# Patient Record
Sex: Female | Born: 1935 | Race: White | Hispanic: No | Marital: Married
Health system: Southern US, Community
[De-identification: ages and names within clinical notes are randomized; demographics above are authoritative.]

## PROBLEM LIST (undated history)

## (undated) DIAGNOSIS — G20A1 Parkinson's disease without dyskinesia, without mention of fluctuations: Secondary | ICD-10-CM

## (undated) DIAGNOSIS — M1990 Unspecified osteoarthritis, unspecified site: Secondary | ICD-10-CM

## (undated) DIAGNOSIS — G20 Parkinson's disease: Secondary | ICD-10-CM

## (undated) DIAGNOSIS — F0390 Unspecified dementia without behavioral disturbance: Secondary | ICD-10-CM

## (undated) DIAGNOSIS — C801 Malignant (primary) neoplasm, unspecified: Secondary | ICD-10-CM

## (undated) DIAGNOSIS — F32A Depression, unspecified: Secondary | ICD-10-CM

## (undated) DIAGNOSIS — E0590 Thyrotoxicosis, unspecified without thyrotoxic crisis or storm: Secondary | ICD-10-CM

## (undated) DIAGNOSIS — F329 Major depressive disorder, single episode, unspecified: Secondary | ICD-10-CM

## (undated) DIAGNOSIS — I499 Cardiac arrhythmia, unspecified: Secondary | ICD-10-CM

## (undated) DIAGNOSIS — I10 Essential (primary) hypertension: Secondary | ICD-10-CM

## (undated) HISTORY — PX: COLONOSCOPY: SHX174

## (undated) HISTORY — PX: OTHER SURGICAL HISTORY: SHX169

## (undated) HISTORY — PX: FACIAL COSMETIC SURGERY: SHX629

---

## 2018-01-07 ENCOUNTER — Other Ambulatory Visit: Payer: Self-pay | Admitting: Neurosurgery

## 2018-01-10 NOTE — Pre-Procedure Instructions (Signed)
Darlyne Stamey [facility]  01/10/2018      [facility] [place], [address] [place] [address] Phone: [phone] Fax: [phone]    Your procedure is scheduled on January 15, 2018.  Report to [facility] Admitting at 900 AM.  Call this number if you have problems the morning of surgery:  [phone]   Remember:  Do not eat or drink after midnight.    Take these medicines the morning of surgery with A SIP OF WATER  Metoprolol (Toprol XL) Amlodipine (norvasc) Methimazole (tapazole) Carbidopa-levodopa (Sinemet) Imodium-if needed Sertraline (zoloft) Tramadol (ultram)  Follow your surgeon's instructions on when to hold/resume Eliquis.  If no instructions were given call the office to determine how they would like to you take Eliquis  7 days prior to surgery STOP taking any diclofenac (voltaren) gel, Aspirin(unless otherwise instructed by your surgeon), Aleve, Naproxen, Ibuprofen, Motrin, Advil, Goody's, BC's, all herbal medications, fish oil, and all vitamins   Do not wear jewelry, make-up or nail polish.  Do not wear lotions, powders, or perfumes, or deodorant.  Do not shave 48 hours prior to surgery.  Men may shave face and neck.  Do not bring valuables to the hospital.  [facility] is not responsible for any belongings or valuables.  Contacts, dentures or bridgework may not be worn into surgery.  Leave your suitcase in the car.  After surgery it may be brought to your room.  For patients admitted to the hospital, discharge time will be determined by your treatment team.  Patients discharged the day of surgery will not be allowed to drive home.    [facility]- Preparing For Surgery  Before surgery, you can play an important role. Because skin is not sterile, your skin needs to be as free of germs as possible. You can reduce the number of germs on your skin by washing with CHG  (chlorahexidine gluconate) Soap before surgery.  CHG is an antiseptic cleaner which kills germs and bonds with the skin to continue killing germs even after washing.    Oral Hygiene is also important to reduce your risk of infection.  Remember - BRUSH YOUR TEETH THE MORNING OF SURGERY WITH YOUR REGULAR TOOTHPASTE  Please do not use if you have an allergy to CHG or antibacterial soaps. If your skin becomes reddened/irritated stop using the CHG.  Do not shave (including legs and underarms) for at least 48 hours prior to first CHG shower. It is OK to shave your face.  Please follow these instructions carefully.   1. Shower the NIGHT BEFORE SURGERY and the MORNING OF SURGERY with CHG.   2. If you chose to wash your hair, wash your hair first as usual with your normal shampoo.  3. After you shampoo, rinse your hair and body thoroughly to remove the shampoo.  4. Use CHG as you would any other liquid soap. You can apply CHG directly to the skin and wash gently with a scrungie or a clean washcloth.   5. Apply the CHG Soap to your body ONLY FROM THE NECK DOWN.  Do not use on open wounds or open sores. Avoid contact with your eyes, ears, mouth and genitals (private parts). Wash Face and genitals (private parts)  with your normal soap.  6. Wash thoroughly, paying special attention to the area where your surgery will be performed.  7. Thoroughly rinse your  body with warm water from the neck down.  8. DO NOT shower/wash with your normal soap after using and rinsing off the CHG Soap.  9. Pat yourself dry with a CLEAN TOWEL.  10. Wear CLEAN PAJAMAS to bed the night before surgery, wear comfortable clothes the morning of surgery  11. Place CLEAN SHEETS on your bed the night of your first shower and DO NOT SLEEP WITH PETS.  Day of Surgery:  Do not apply any deodorants/lotions.  Please wear clean clothes to the hospital/surgery center.   Remember to brush your teeth WITH YOUR REGULAR  TOOTHPASTE.  Please read over the following fact sheets that you were given. Pain Booklet, Coughing and Deep Breathing and Surgical Site Infection Prevention

## 2018-01-10 NOTE — Progress Notes (Addendum)
PCP: Smith Mince, MD  Cardiologist: Lamar Blinks, MD  EKG: obtained today  Stress test: requested from Dr. Lockie Pares office  ECHO: denies  Cardiac Cath: pt denies  Chest x-ray: denies  Pt on Eliquis-advised to begin holding 01/10/17

## 2018-01-13 ENCOUNTER — Encounter (HOSPITAL_COMMUNITY): Payer: Self-pay

## 2018-01-13 ENCOUNTER — Encounter (HOSPITAL_COMMUNITY)
Admission: RE | Admit: 2018-01-13 | Discharge: 2018-01-13 | Disposition: A | Discharge status: Home / Self Care | Payer: Medicare Other | Source: Ambulatory Visit | Attending: Neurosurgery | Admitting: Neurosurgery

## 2018-01-13 ENCOUNTER — Other Ambulatory Visit: Payer: Self-pay

## 2018-01-13 DIAGNOSIS — F329 Major depressive disorder, single episode, unspecified: Secondary | ICD-10-CM | POA: Diagnosis not present

## 2018-01-13 DIAGNOSIS — Z882 Allergy status to sulfonamides status: Secondary | ICD-10-CM | POA: Diagnosis not present

## 2018-01-13 DIAGNOSIS — Z79899 Other long term (current) drug therapy: Secondary | ICD-10-CM | POA: Diagnosis not present

## 2018-01-13 DIAGNOSIS — Z85828 Personal history of other malignant neoplasm of skin: Secondary | ICD-10-CM | POA: Diagnosis not present

## 2018-01-13 DIAGNOSIS — M1990 Unspecified osteoarthritis, unspecified site: Secondary | ICD-10-CM | POA: Diagnosis not present

## 2018-01-13 DIAGNOSIS — Z7901 Long term (current) use of anticoagulants: Secondary | ICD-10-CM | POA: Diagnosis not present

## 2018-01-13 DIAGNOSIS — I10 Essential (primary) hypertension: Secondary | ICD-10-CM | POA: Diagnosis not present

## 2018-01-13 DIAGNOSIS — I4891 Unspecified atrial fibrillation: Secondary | ICD-10-CM | POA: Diagnosis not present

## 2018-01-13 DIAGNOSIS — G20 Parkinson's disease: Secondary | ICD-10-CM | POA: Diagnosis not present

## 2018-01-13 DIAGNOSIS — M5116 Intervertebral disc disorders with radiculopathy, lumbar region: Secondary | ICD-10-CM | POA: Diagnosis not present

## 2018-01-13 HISTORY — DX: Major depressive disorder, single episode, unspecified: F32.9

## 2018-01-13 HISTORY — DX: Cardiac arrhythmia, unspecified: I49.9

## 2018-01-13 HISTORY — DX: Thyrotoxicosis, unspecified without thyrotoxic crisis or storm: E05.90

## 2018-01-13 HISTORY — DX: Essential (primary) hypertension: I10

## 2018-01-13 HISTORY — DX: Malignant (primary) neoplasm, unspecified: C80.1

## 2018-01-13 HISTORY — DX: Unspecified osteoarthritis, unspecified site: M19.90

## 2018-01-13 HISTORY — DX: Depression, unspecified: F32.A

## 2018-01-13 HISTORY — DX: Parkinson's disease without dyskinesia, without mention of fluctuations: G20.A1

## 2018-01-13 HISTORY — DX: Parkinson's disease: G20

## 2018-01-13 LAB — SURGICAL PCR SCREEN
MRSA, PCR: NEGATIVE
Staphylococcus aureus: NEGATIVE

## 2018-01-13 LAB — BASIC METABOLIC PANEL
ANION GAP: 10 (ref 5–15)
BUN: 17 mg/dL (ref 8–23)
CO2: 27 mmol/L (ref 22–32)
Calcium: 9.1 mg/dL (ref 8.9–10.3)
Chloride: 102 mmol/L (ref 98–111)
Creatinine, Ser: 0.81 mg/dL (ref 0.44–1.00)
GFR calc non Af Amer: >60 mL/min (ref >60)
Glucose, Bld: 115 mg/dL — ABNORMAL HIGH (ref 70–99)
POTASSIUM: 4 mmol/L (ref 3.5–5.1)
SODIUM: 139 mmol/L (ref 135–145)

## 2018-01-13 LAB — CBC
HEMATOCRIT: 43.9 % (ref 36.0–46.0)
HEMOGLOBIN: 14 g/dL (ref 12.0–15.0)
MCH: 30 pg (ref 26.0–34.0)
MCHC: 31.9 g/dL (ref 30.0–36.0)
MCV: 94.2 fL (ref 78.0–100.0)
PLATELETS: 287 10*3/uL (ref 150–400)
RBC: 4.66 MIL/uL (ref 3.87–5.11)
RDW: 12.8 % (ref 11.5–15.5)
WBC: 11.1 10*3/uL — AB (ref 4.0–10.5)

## 2018-01-13 LAB — PROTIME-INR
INR: 1.06
Prothrombin Time: 13.7 seconds (ref 11.4–15.2)

## 2018-01-13 NOTE — Progress Notes (Addendum)
Anesthesia Chart Review:  Case:  [id number] Date/Time:  01/15/18 1025   Procedure:  Microdiscectomy - L3-L4 - right (Right Back)   Anesthesia type:  General   Pre-op diagnosis:  HNP   Location:  [facility] OR ROOM 18 / [facility]   Surgeon:  Kary Kos, MD      DISCUSSION: Patient is a 84 year old female scheduled for the above procedure. 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  - Primary care visit 12/11/17 for right eye blurry vision. DDx included amaurosis fugax, acute angle glaucoma or recurrence of cataracts. She was referred urgently to ophthalmology [facility] I spoke with patient and her husband Dr. Jeri Modena who is a retired family practice physician about the outcome. Dr. Hardin Negus said that Mrs. Margerum was diagnosed with dry eyes and treated with eye drops. He said that she did not require additional testing such as carotid U/S or head CT. Patient said symptoms have resolved.   Reported being instructed to hold Eliquis starting 01/10/17 per Dr. Saintclair Halsted.   Patient up to date with endocrinology and cardiology follow-up. By Dr. Hardin Negus account, recent blurred vision felt to be related to dry eyes. Reviewed with anesthesiologist Dr. Tamela Gammon. If no acute changes then it is anticipated that he can proceed as planned. Of note, Dr. Hardin Negus said he is only planning to have Mrs. Hayse take metoprolol and amlodipine on the morning of surgery due to GI upset if other medications taken (particularly Sinemet if on an empty stomach).  Based on current OR time, patient instructed to arrive at 8:40 AM instead of 9:00 AM.  VS: BP 138/84 Comment: manually rechecked  Pulse (!) 52   Temp 36.4 C   Resp 18   Ht 5' 2.5" (1.588 m)   Wt 100 lb (45.4 kg)   SpO2 99%   BMI 18.00 kg/m   PROVIDERS: - PCP is Joneen Boers, MD [place]) -  Cardiologist is Lamar Blinks, MD (see Care Everywhere). Last visit 09/12/17 with one year follow-up recommended. - Endocrinologist is Deedra Ehrich, MD ([place]). Last office visit 10/02/17. Thyroid function tests stable on low dose methimazole. Had dominant right mid-inferior pole hot nodule, hyperactive on 2009 scan, but stable by serial ultrasounds. Also left inferior pole cold nodule with benign FNA '11. One year follow-up recommended.  - Neurologist is Kyra Searles, MD with [facility] (although I am not currently able to link her [place] records)   LABS: Labs reviewed: Acceptable for surgery. Normal TSH 0.716 on 04/01/17.  (all labs ordered are listed, but only abnormal results are displayed)  Labs Reviewed  BASIC METABOLIC PANEL - Abnormal; Notable for the following components:      Result Value   Glucose, Bld 115 (*)    All other components within normal limits  CBC - Abnormal; Notable for the following components:   WBC 11.1 (*)    All other components within normal limits  SURGICAL PCR SCREEN  PROTIME-INR    EKG: EKG 01/13/18: SB at 54 bpm, minimal voltage criteria for LVH, may be normal variant. Non-specific ST/T wave abnormality. (Currently, no comparison EKG tracing. By [place], 09/12/17 EKG showed SR, non-specific T wave abnormality.UPDATE: tracing received and placed on patient's chart.)  CV: 01/2015 stress echo report requested, but according to 03/15/15 cardiology note Zettie Pho, [place]; [facility]), "She had a recent stress echo  which looked okay. She has a history of normal LV function."   Update 01/14/18 3:43 PM:  01/25/15 Stress echo report received from [place] (under the name Durward Mallard Cassity Stamey): Summary:  This was a normal stress echocardiogram.  No inducible ischemia.  There is normal regional wall motion and ejection fraction at rest with hyperdynamic response in all LV segments and  appropriate augmentation of overall ejection fraction after stress. Test terminated secondary to achieving diagnostic endpoint with 73% Max Peak HR achieved.  The left ventricle is normal in size.  Proximal septal thickening is noted.  The left ventricular ejection fraction is normal (60-65%).  Left ventricular wall motion is normal.  The aortic valve is trileaflet.  Grade 1 mild diastolic dysfunction; abnormal relaxation pattern.  Right ventricular systolic pressure is normal.  No inducible ischemia.  Post exercise ECG demonstrates no new ST segment depression.   Past Medical History:  Diagnosis Date  . Arthritis   . 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 Depression    on zoloft  . Dysrhythmia    Atrial fibrilliation  . Hypertension   . Hyperthyroidism   . Parkinson disease [facility])    Neurologist Dr. Kyra Searles [facility])    Past Surgical History:  Procedure Laterality Date  . COLONOSCOPY    . excision of skin cancer    . FACIAL COSMETIC SURGERY      MEDICATIONS: . amLODipine (NORVASC) 2.5 MG tablet  . apixaban (ELIQUIS) 2.5 MG TABS tablet  . Calcium Carbonate-Vitamin D (CALCIUM 600+D) 600-400 MG-UNIT tablet  . carbidopa-levodopa (SINEMET IR) 10-100 MG tablet  . Carbidopa-Levodopa ER 36.25-145 MG CPCR  . denosumab (PROLIA) 60 MG/ML SOSY injection  . diclofenac sodium (VOLTAREN) 1 % GEL  . donepezil (ARICEPT) 10 MG tablet  . ibuprofen (ADVIL,MOTRIN) 200 MG tablet  . Ibuprofen-diphenhydrAMINE Cit (ADVIL PM) 200-38 MG TABS  . loperamide (IMODIUM A-D) 2 MG tablet  . methimazole (TAPAZOLE) 5 MG tablet  . metoprolol succinate (TOPROL-XL) 25 MG 24 hr tablet  . Multiple Vitamin (MULTIVITAMIN WITH MINERALS) TABS tablet  . Multiple Vitamins-Minerals (PRESERVISION AREDS 2) CAPS  . Probiotic CAPS  . sertraline (ZOLOFT) 25 MG tablet  . traMADol (ULTRAM) 50 MG tablet  . White Petrolatum-Mineral Oil (EYE LUBRICANT OP)   No current facility-administered medications for this  encounter.    George [facility] Short Stay Center/Anesthesiology Phone [phone] 01/13/2018 5:24 PM

## 2018-01-15 ENCOUNTER — Encounter (HOSPITAL_COMMUNITY)
Admission: RE | Disposition: A | Discharge status: Home / Self Care | Payer: Self-pay | Source: Ambulatory Visit | Attending: Neurosurgery

## 2018-01-15 ENCOUNTER — Ambulatory Visit (HOSPITAL_COMMUNITY)
Admission: RE | Admit: 2018-01-15 | Discharge: 2018-01-16 | Disposition: A | Discharge status: Home / Self Care | Payer: Medicare Other | Source: Ambulatory Visit | Attending: Neurosurgery | Admitting: Neurosurgery

## 2018-01-15 ENCOUNTER — Ambulatory Visit (HOSPITAL_COMMUNITY): Payer: Medicare Other | Admitting: Vascular Surgery

## 2018-01-15 ENCOUNTER — Ambulatory Visit (HOSPITAL_COMMUNITY): Payer: Medicare Other

## 2018-01-15 ENCOUNTER — Ambulatory Visit (HOSPITAL_COMMUNITY): Payer: Medicare Other | Admitting: Anesthesiology

## 2018-01-15 ENCOUNTER — Encounter (HOSPITAL_COMMUNITY): Payer: Self-pay | Admitting: Surgery

## 2018-01-15 ENCOUNTER — Other Ambulatory Visit: Payer: Self-pay

## 2018-01-15 DIAGNOSIS — F329 Major depressive disorder, single episode, unspecified: Secondary | ICD-10-CM | POA: Insufficient documentation

## 2018-01-15 DIAGNOSIS — M5116 Intervertebral disc disorders with radiculopathy, lumbar region: Secondary | ICD-10-CM | POA: Insufficient documentation

## 2018-01-15 DIAGNOSIS — Z419 Encounter for procedure for purposes other than remedying health state, unspecified: Secondary | ICD-10-CM

## 2018-01-15 DIAGNOSIS — I10 Essential (primary) hypertension: Secondary | ICD-10-CM | POA: Diagnosis not present

## 2018-01-15 DIAGNOSIS — G20 Parkinson's disease: Secondary | ICD-10-CM | POA: Diagnosis not present

## 2018-01-15 DIAGNOSIS — Z882 Allergy status to sulfonamides status: Secondary | ICD-10-CM | POA: Insufficient documentation

## 2018-01-15 DIAGNOSIS — Z79899 Other long term (current) drug therapy: Secondary | ICD-10-CM | POA: Insufficient documentation

## 2018-01-15 DIAGNOSIS — M5126 Other intervertebral disc displacement, lumbar region: Secondary | ICD-10-CM | POA: Diagnosis present

## 2018-01-15 DIAGNOSIS — M1990 Unspecified osteoarthritis, unspecified site: Secondary | ICD-10-CM | POA: Insufficient documentation

## 2018-01-15 DIAGNOSIS — I4891 Unspecified atrial fibrillation: Secondary | ICD-10-CM | POA: Diagnosis not present

## 2018-01-15 DIAGNOSIS — Z85828 Personal history of other malignant neoplasm of skin: Secondary | ICD-10-CM | POA: Insufficient documentation

## 2018-01-15 DIAGNOSIS — Z7901 Long term (current) use of anticoagulants: Secondary | ICD-10-CM | POA: Insufficient documentation

## 2018-01-15 HISTORY — PX: LUMBAR LAMINECTOMY/DECOMPRESSION MICRODISCECTOMY: SHX5026

## 2018-01-15 SURGERY — LUMBAR LAMINECTOMY/DECOMPRESSION MICRODISCECTOMY 1 LEVEL
Anesthesia: General | Site: Spine Lumbar | Laterality: Right

## 2018-01-15 MED ORDER — ONDANSETRON HCL 4 MG/2ML IJ SOLN
INTRAMUSCULAR | Status: DC | PRN
  Administered 2018-01-15: 4 mg via INTRAVENOUS

## 2018-01-15 MED ORDER — ONDANSETRON HCL 4 MG/2ML IJ SOLN
INTRAMUSCULAR | Status: AC
  Filled 2018-01-15: qty 2

## 2018-01-15 MED ORDER — LIDOCAINE-EPINEPHRINE 1 %-1:100000 IJ SOLN
INTRAMUSCULAR | Status: AC
  Filled 2018-01-15: qty 1

## 2018-01-15 MED ORDER — METOCLOPRAMIDE HCL 5 MG/ML IJ SOLN: 10.0000 mg | Freq: Once | INTRAMUSCULAR | Status: DC | PRN

## 2018-01-15 MED ORDER — LOPERAMIDE HCL 2 MG PO CAPS
2.0000 mg | ORAL_CAPSULE | ORAL | Status: DC | PRN
  Filled 2018-01-15: qty 1

## 2018-01-15 MED ORDER — CEFAZOLIN SODIUM-DEXTROSE 2-4 GM/100ML-% IV SOLN
2.0000 g | Freq: Three times a day (TID) | INTRAVENOUS | Status: AC
  Administered 2018-01-15 – 2018-01-16 (×2): 2 g via INTRAVENOUS
  Filled 2018-01-15 (×2): qty 100

## 2018-01-15 MED ORDER — FENTANYL CITRATE (PF) 100 MCG/2ML IJ SOLN: 25.0000 ug | INTRAMUSCULAR | Status: DC | PRN

## 2018-01-15 MED ORDER — SODIUM CHLORIDE 0.9 % IV SOLN
250.0000 mL | INTRAVENOUS | Status: DC
  Administered 2018-01-15: 250 mL via INTRAVENOUS

## 2018-01-15 MED ORDER — AMLODIPINE BESYLATE 5 MG PO TABS
2.5000 mg | ORAL_TABLET | Freq: Every day | ORAL | Status: DC
  Administered 2018-01-16: 2.5 mg via ORAL

## 2018-01-15 MED ORDER — LACTATED RINGERS IV SOLN
INTRAVENOUS | Status: DC
  Administered 2018-01-15 (×2): via INTRAVENOUS

## 2018-01-15 MED ORDER — ALUM & MAG HYDROXIDE-SIMETH 200-200-20 MG/5ML PO SUSP: 30.0000 mL | Freq: Four times a day (QID) | ORAL | Status: DC | PRN

## 2018-01-15 MED ORDER — CYCLOBENZAPRINE HCL 10 MG PO TABS: 10.0000 mg | ORAL_TABLET | Freq: Three times a day (TID) | ORAL | Status: DC | PRN

## 2018-01-15 MED ORDER — PROPOFOL 10 MG/ML IV BOLUS
INTRAVENOUS | Status: DC | PRN
  Administered 2018-01-15: 120 mg via INTRAVENOUS

## 2018-01-15 MED ORDER — OCUVITE-LUTEIN PO CAPS
1.0000 | ORAL_CAPSULE | Freq: Every day | ORAL | Status: DC
  Filled 2018-01-15: qty 1

## 2018-01-15 MED ORDER — RISAQUAD PO CAPS
1.0000 | ORAL_CAPSULE | Freq: Every day | ORAL | Status: DC
  Administered 2018-01-15 – 2018-01-16 (×2): 1 via ORAL
  Filled 2018-01-15 (×3): qty 1

## 2018-01-15 MED ORDER — SUGAMMADEX SODIUM 200 MG/2ML IV SOLN
INTRAVENOUS | Status: DC | PRN
  Administered 2018-01-15: 100 mg via INTRAVENOUS

## 2018-01-15 MED ORDER — SODIUM CHLORIDE 0.9 % IV SOLN
INTRAVENOUS | Status: DC | PRN
  Administered 2018-01-15: 25 ug/min via INTRAVENOUS

## 2018-01-15 MED ORDER — ONDANSETRON HCL 4 MG PO TABS: 4.0000 mg | ORAL_TABLET | Freq: Four times a day (QID) | ORAL | Status: DC | PRN

## 2018-01-15 MED ORDER — ACETAMINOPHEN 325 MG PO TABS: 650.0000 mg | ORAL_TABLET | ORAL | Status: DC | PRN

## 2018-01-15 MED ORDER — HYDROCODONE-ACETAMINOPHEN 5-325 MG PO TABS
1.0000 | ORAL_TABLET | Freq: Four times a day (QID) | ORAL | Status: DC | PRN
  Administered 2018-01-15 – 2018-01-16 (×3): 1 via ORAL
  Filled 2018-01-15 (×3): qty 1

## 2018-01-15 MED ORDER — PANTOPRAZOLE SODIUM 20 MG PO TBEC
20.0000 mg | DELAYED_RELEASE_TABLET | Freq: Every day | ORAL | Status: DC
  Administered 2018-01-16: 20 mg via ORAL
  Filled 2018-01-15: qty 1

## 2018-01-15 MED ORDER — BUPIVACAINE HCL (PF) 0.25 % IJ SOLN
INTRAMUSCULAR | Status: DC | PRN
  Administered 2018-01-15: 7 mL

## 2018-01-15 MED ORDER — EPHEDRINE SULFATE 50 MG/ML IJ SOLN
INTRAMUSCULAR | Status: AC
  Filled 2018-01-15: qty 1

## 2018-01-15 MED ORDER — ACETAMINOPHEN 650 MG RE SUPP: 650.0000 mg | RECTAL | Status: DC | PRN

## 2018-01-15 MED ORDER — ADULT MULTIVITAMIN W/MINERALS CH: 1.0000 | ORAL_TABLET | Freq: Every day | ORAL | Status: DC

## 2018-01-15 MED ORDER — THROMBIN 5000 UNITS EX SOLR
CUTANEOUS | Status: DC | PRN
  Administered 2018-01-15 (×2): 5000 [IU] via TOPICAL

## 2018-01-15 MED ORDER — CALCIUM CARBONATE-VITAMIN D 500-200 MG-UNIT PO TABS
1.0000 | ORAL_TABLET | Freq: Every day | ORAL | Status: DC
  Administered 2018-01-15 – 2018-01-16 (×2): 1 via ORAL
  Filled 2018-01-15 (×3): qty 1

## 2018-01-15 MED ORDER — CARBIDOPA-LEVODOPA 10-100 MG PO TABS
1.0000 | ORAL_TABLET | Freq: Three times a day (TID) | ORAL | Status: DC
  Administered 2018-01-15: 1 via ORAL
  Filled 2018-01-15 (×4): qty 1

## 2018-01-15 MED ORDER — ROCURONIUM BROMIDE 100 MG/10ML IV SOLN
INTRAVENOUS | Status: DC | PRN
  Administered 2018-01-15: 60 mg via INTRAVENOUS

## 2018-01-15 MED ORDER — SERTRALINE HCL 50 MG PO TABS
25.0000 mg | ORAL_TABLET | Freq: Every day | ORAL | Status: DC
  Administered 2018-01-15 – 2018-01-16 (×2): 25 mg via ORAL
  Filled 2018-01-15: qty 1

## 2018-01-15 MED ORDER — PROPOFOL 10 MG/ML IV BOLUS
INTRAVENOUS | Status: AC
  Filled 2018-01-15: qty 20

## 2018-01-15 MED ORDER — DONEPEZIL HCL 10 MG PO TABS
10.0000 mg | ORAL_TABLET | Freq: Every evening | ORAL | Status: DC
  Administered 2018-01-15: 10 mg via ORAL
  Filled 2018-01-15 (×2): qty 1

## 2018-01-15 MED ORDER — METHIMAZOLE 5 MG PO TABS
5.0000 mg | ORAL_TABLET | Freq: Every day | ORAL | Status: DC
  Administered 2018-01-16: 5 mg via ORAL
  Filled 2018-01-15: qty 1

## 2018-01-15 MED ORDER — SODIUM CHLORIDE 0.9% FLUSH
3.0000 mL | Freq: Two times a day (BID) | INTRAVENOUS | Status: DC
  Administered 2018-01-15: 3 mL via INTRAVENOUS

## 2018-01-15 MED ORDER — HEMOSTATIC AGENTS (NO CHARGE) OPTIME
TOPICAL | Status: DC | PRN
  Administered 2018-01-15: 1 via TOPICAL

## 2018-01-15 MED ORDER — HYDROMORPHONE HCL 1 MG/ML IJ SOLN: 0.2500 mg | INTRAMUSCULAR | Status: DC | PRN

## 2018-01-15 MED ORDER — BUPIVACAINE HCL (PF) 0.25 % IJ SOLN
INTRAMUSCULAR | Status: AC
  Filled 2018-01-15: qty 30

## 2018-01-15 MED ORDER — ONDANSETRON HCL 4 MG/2ML IJ SOLN: 4.0000 mg | Freq: Four times a day (QID) | INTRAMUSCULAR | Status: DC | PRN

## 2018-01-15 MED ORDER — IBUPROFEN 200 MG PO TABS: 400.0000 mg | ORAL_TABLET | Freq: Three times a day (TID) | ORAL | Status: DC | PRN

## 2018-01-15 MED ORDER — MENTHOL 3 MG MT LOZG: 1.0000 | LOZENGE | OROMUCOSAL | Status: DC | PRN

## 2018-01-15 MED ORDER — DENOSUMAB 60 MG/ML ~~LOC~~ SOSY: 60.0000 mg | PREFILLED_SYRINGE | SUBCUTANEOUS | Status: DC

## 2018-01-15 MED ORDER — EPHEDRINE SULFATE 50 MG/ML IJ SOLN
INTRAMUSCULAR | Status: DC | PRN
  Administered 2018-01-15: 5 mg via INTRAVENOUS

## 2018-01-15 MED ORDER — SODIUM CHLORIDE 0.9% FLUSH: 3.0000 mL | INTRAVENOUS | Status: DC | PRN

## 2018-01-15 MED ORDER — PHENOL 1.4 % MT LIQD: 1.0000 | OROMUCOSAL | Status: DC | PRN

## 2018-01-15 MED ORDER — SUGAMMADEX SODIUM 200 MG/2ML IV SOLN
INTRAVENOUS | Status: AC
  Filled 2018-01-15: qty 2

## 2018-01-15 MED ORDER — MEPERIDINE HCL 50 MG/ML IJ SOLN: 6.2500 mg | INTRAMUSCULAR | Status: DC | PRN

## 2018-01-15 MED ORDER — FENTANYL CITRATE (PF) 250 MCG/5ML IJ SOLN
INTRAMUSCULAR | Status: AC
  Filled 2018-01-15: qty 5

## 2018-01-15 MED ORDER — ROCURONIUM BROMIDE 10 MG/ML (PF) SYRINGE
PREFILLED_SYRINGE | INTRAVENOUS | Status: AC
  Filled 2018-01-15: qty 10

## 2018-01-15 MED ORDER — CEFAZOLIN SODIUM-DEXTROSE 2-3 GM-%(50ML) IV SOLR
INTRAVENOUS | Status: DC | PRN
  Administered 2018-01-15: 2 g via INTRAVENOUS

## 2018-01-15 MED ORDER — METOPROLOL SUCCINATE ER 25 MG PO TB24
25.0000 mg | ORAL_TABLET | Freq: Two times a day (BID) | ORAL | Status: DC
Start: 2018-01-15 — End: 2018-01-16
  Administered 2018-01-15 – 2018-01-16 (×2): 25 mg via ORAL
  Filled 2018-01-15: qty 1

## 2018-01-15 MED ORDER — SODIUM CHLORIDE 0.9 % IV SOLN
INTRAVENOUS | Status: DC | PRN
  Administered 2018-01-15: 12:00:00

## 2018-01-15 MED ORDER — 0.9 % SODIUM CHLORIDE (POUR BTL) OPTIME
TOPICAL | Status: DC | PRN
  Administered 2018-01-15: 1000 mL

## 2018-01-15 MED ORDER — CARBIDOPA-LEVODOPA ER 36.25-145 MG PO CPCR
1.0000 | ORAL_CAPSULE | Freq: Every day | ORAL | Status: DC
  Administered 2018-01-15: 1 via ORAL

## 2018-01-15 MED ORDER — LIDOCAINE-EPINEPHRINE 1 %-1:100000 IJ SOLN
INTRAMUSCULAR | Status: DC | PRN
  Administered 2018-01-15: 6 mL

## 2018-01-15 MED ORDER — LIDOCAINE HCL (CARDIAC) PF 100 MG/5ML IV SOSY
PREFILLED_SYRINGE | INTRAVENOUS | Status: DC | PRN
  Administered 2018-01-15: 80 mg via INTRAVENOUS

## 2018-01-15 MED ORDER — FENTANYL CITRATE (PF) 100 MCG/2ML IJ SOLN
INTRAMUSCULAR | Status: DC | PRN
  Administered 2018-01-15: 50 ug via INTRAVENOUS
  Administered 2018-01-15: 25 ug via INTRAVENOUS
  Administered 2018-01-15: 50 ug via INTRAVENOUS

## 2018-01-15 MED ORDER — LIDOCAINE 2% (20 MG/ML) 5 ML SYRINGE
INTRAMUSCULAR | Status: AC
  Filled 2018-01-15: qty 5

## 2018-01-15 MED ORDER — IBUPROFEN-DIPHENHYDRAMINE CIT 200-38 MG PO TABS: 1.0000 | ORAL_TABLET | Freq: Every evening | ORAL | Status: DC | PRN

## 2018-01-15 MED ORDER — HYDROMORPHONE HCL 1 MG/ML IJ SOLN: 0.5000 mg | INTRAMUSCULAR | Status: DC | PRN

## 2018-01-15 MED ORDER — DICLOFENAC SODIUM 1 % TD GEL: 1.0000 "application " | Freq: Four times a day (QID) | TRANSDERMAL | Status: DC | PRN

## 2018-01-15 MED ORDER — THROMBIN 5000 UNITS EX SOLR
CUTANEOUS | Status: AC
  Filled 2018-01-15: qty 15000

## 2018-01-15 MED ORDER — APIXABAN 2.5 MG PO TABS: 2.5000 mg | ORAL_TABLET | Freq: Two times a day (BID) | ORAL | Status: DC

## 2018-01-15 MED ORDER — TRAMADOL HCL 50 MG PO TABS
50.0000 mg | ORAL_TABLET | Freq: Four times a day (QID) | ORAL | Status: DC | PRN
  Administered 2018-01-15 – 2018-01-16 (×2): 50 mg via ORAL
  Filled 2018-01-15 (×2): qty 1

## 2018-01-15 MED ORDER — CEFAZOLIN SODIUM-DEXTROSE 2-4 GM/100ML-% IV SOLN
INTRAVENOUS | Status: AC
  Filled 2018-01-15: qty 100

## 2018-01-15 MED ORDER — ARTIFICIAL TEARS OPHTHALMIC OINT
TOPICAL_OINTMENT | Freq: Every day | OPHTHALMIC | Status: DC
  Filled 2018-01-15: qty 3.5

## 2018-01-15 SURGICAL SUPPLY — 53 items
BAG DECANTER FOR FLEXI CONT (MISCELLANEOUS) ×3 IMPLANT
BENZOIN TINCTURE PRP APPL 2/3 (GAUZE/BANDAGES/DRESSINGS) ×3 IMPLANT
BLADE CLIPPER SURG (BLADE) IMPLANT
BLADE SURG 11 STRL SS (BLADE) ×3 IMPLANT
BUR CUTTER 7.0 ROUND (BURR) ×3 IMPLANT
BUR MATCHSTICK NEURO 3.0 LAGG (BURR) ×3 IMPLANT
CANISTER SUCT 3000ML PPV (MISCELLANEOUS) ×3 IMPLANT
CARTRIDGE OIL MAESTRO DRILL (MISCELLANEOUS) ×1 IMPLANT
CLOSURE WOUND 1/2 X4 (GAUZE/BANDAGES/DRESSINGS) ×1
DECANTER SPIKE VIAL GLASS SM (MISCELLANEOUS) ×3 IMPLANT
DERMABOND ADVANCED (GAUZE/BANDAGES/DRESSINGS) ×2
DERMABOND ADVANCED .7 DNX12 (GAUZE/BANDAGES/DRESSINGS) ×1 IMPLANT
DIFFUSER DRILL AIR PNEUMATIC (MISCELLANEOUS) ×3 IMPLANT
DRAPE HALF SHEET 40X57 (DRAPES) IMPLANT
DRAPE LAPAROTOMY 100X72X124 (DRAPES) ×3 IMPLANT
DRAPE MICROSCOPE LEICA (MISCELLANEOUS) ×3 IMPLANT
DRAPE SURG 17X23 STRL (DRAPES) ×3 IMPLANT
DRSG OPSITE POSTOP 4X6 (GAUZE/BANDAGES/DRESSINGS) ×3 IMPLANT
DURAPREP 26ML APPLICATOR (WOUND CARE) ×3 IMPLANT
ELECT REM PT RETURN 9FT ADLT (ELECTROSURGICAL) ×3
ELECTRODE REM PT RTRN 9FT ADLT (ELECTROSURGICAL) ×1 IMPLANT
GAUZE SPONGE 4X4 12PLY STRL (GAUZE/BANDAGES/DRESSINGS) ×3 IMPLANT
GAUZE SPONGE 4X4 16PLY XRAY LF (GAUZE/BANDAGES/DRESSINGS) IMPLANT
GLOVE BIO SURGEON STRL SZ7 (GLOVE) IMPLANT
GLOVE BIO SURGEON STRL SZ8 (GLOVE) ×3 IMPLANT
GLOVE BIOGEL PI IND STRL 7.0 (GLOVE) IMPLANT
GLOVE BIOGEL PI INDICATOR 7.0 (GLOVE)
GLOVE EXAM NITRILE LRG STRL (GLOVE) IMPLANT
GLOVE EXAM NITRILE XL STR (GLOVE) IMPLANT
GLOVE EXAM NITRILE XS STR PU (GLOVE) IMPLANT
GLOVE INDICATOR 8.5 STRL (GLOVE) ×3 IMPLANT
GOWN STRL REUS W/ TWL LRG LVL3 (GOWN DISPOSABLE) ×1 IMPLANT
GOWN STRL REUS W/ TWL XL LVL3 (GOWN DISPOSABLE) ×2 IMPLANT
GOWN STRL REUS W/TWL 2XL LVL3 (GOWN DISPOSABLE) IMPLANT
GOWN STRL REUS W/TWL LRG LVL3 (GOWN DISPOSABLE) ×2
GOWN STRL REUS W/TWL XL LVL3 (GOWN DISPOSABLE) ×4
KIT BASIN OR (CUSTOM PROCEDURE TRAY) ×3 IMPLANT
KIT TURNOVER KIT B (KITS) ×3 IMPLANT
NEEDLE HYPO 22GX1.5 SAFETY (NEEDLE) ×3 IMPLANT
NEEDLE SPNL 22GX3.5 QUINCKE BK (NEEDLE) ×3 IMPLANT
NS IRRIG 1000ML POUR BTL (IV SOLUTION) ×3 IMPLANT
OIL CARTRIDGE MAESTRO DRILL (MISCELLANEOUS) ×3
PACK LAMINECTOMY NEURO (CUSTOM PROCEDURE TRAY) ×3 IMPLANT
RUBBERBAND STERILE (MISCELLANEOUS) ×6 IMPLANT
SPONGE SURGIFOAM ABS GEL SZ50 (HEMOSTASIS) ×3 IMPLANT
STRIP CLOSURE SKIN 1/2X4 (GAUZE/BANDAGES/DRESSINGS) ×2 IMPLANT
SUT VIC AB 0 CT1 18XCR BRD8 (SUTURE) ×1 IMPLANT
SUT VIC AB 0 CT1 8-18 (SUTURE) ×2
SUT VIC AB 2-0 CT1 18 (SUTURE) ×3 IMPLANT
SUT VICRYL 4-0 PS2 18IN ABS (SUTURE) ×3 IMPLANT
TOWEL GREEN STERILE (TOWEL DISPOSABLE) ×3 IMPLANT
TOWEL GREEN STERILE FF (TOWEL DISPOSABLE) ×3 IMPLANT
WATER STERILE IRR 1000ML POUR (IV SOLUTION) ×3 IMPLANT

## 2018-01-15 NOTE — H&P (Signed)
Carly Alexander is an 84 y.o. female.   Chief Complaint: right hip and leg pain HPI: a 84 year old female with long-standing chronic back trouble for the last several months and years of progress worsening right hip and leg pain. Refractory to epidural steroid injections anti-inflammatories therapy. 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 Due to patient's progression of clinical syndrome imaging findings and failure of conservative treatment I recommended lumbar laminectomy microdiscectomy at L3-4 on the right in addition to an extra foraminal decompression of the L3 nerve root. I've extensively gone over the risks and benefits of the procedure with her as well as perioperative course expectations of outcome and alternatives surgery and she understood and agreed to proceed forward.  Past Medical History:  Diagnosis Date  . Arthritis   . 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 Depression    on zoloft  . Dysrhythmia    Atrial fibrilliation  . Hypertension   . Hyperthyroidism   . Parkinson disease [facility])    Neurologist Dr. Kyra Searles [facility])    Past Surgical History:  Procedure Laterality Date  . COLONOSCOPY    . excision of skin cancer    . FACIAL COSMETIC SURGERY      History reviewed. No pertinent family history. Social History:  reports that she has never smoked. She has never used smokeless tobacco. She reports that she drank alcohol. She reports that she has current or past drug history.  Allergies:  Allergies  Allergen Reactions  . Sulfa Antibiotics Rash    Medications Prior to Admission  Medication Sig Dispense Refill  . amLODipine (NORVASC) 2.5 MG tablet Take 2.5 mg by mouth daily.  4  . apixaban (ELIQUIS) 2.5 MG TABS tablet Take 2.5 mg by mouth 2 (two) times daily.    . Calcium Carbonate-Vitamin D (CALCIUM 600+D) 600-400  MG-UNIT tablet Take 1 tablet by mouth daily.    . carbidopa-levodopa (SINEMET IR) 10-100 MG tablet Take 1 tablet by mouth 3 (three) times daily.    . Carbidopa-Levodopa ER 36.25-145 MG CPCR Take 1 tablet by mouth at bedtime.    . diclofenac sodium (VOLTAREN) 1 % GEL Apply 1 application topically 4 (four) times daily as needed (pain).    [place] Kitchen donepezil (ARICEPT) 10 MG tablet Take 10 mg by mouth every evening.    [place] Kitchen HYDROcodone-acetaminophen (NORCO/VICODIN) 5-325 MG tablet Take 1 tablet by mouth every 6 (six) hours as needed for moderate pain.    Mar[place] Kitchen ibuprofen (ADVIL,MOTRIN) 200 MG tablet Take 400 mg by mouth 3 (three) times daily as needed for headache or moderate pain.    . Ibuprofen-diphenhydrAMINE Cit (ADVIL PM) 200-38 MG TABS Take 1 tablet by mouth at bedtime as needed (sleep).    . methimazole (TAPAZOLE) 5 MG tablet Take 5 mg by mouth daily.    . metoprolol succinate (TOPROL-XL) 25 MG 24 hr tablet Take 25 mg by mouth 2 (two) times daily.    . Multiple Vitamin (MULTIVITAMIN WITH MINERALS) TABS tablet Take 1 tablet by mouth daily.    . Multiple Vitamins-Minerals (PRESERVISION AREDS 2) CAPS Take 1 capsule by mouth daily.    . Probiotic CAPS Take 1 capsule by mouth daily.    . sertraline (ZOLOFT) 25 MG tablet Take 25 mg by mouth daily.  4  . traMADol (ULTRAM) 50 MG tablet Take 50 mg by mouth 4 (  four) times daily as needed for pain.  0  . White Petrolatum-Mineral Oil (EYE LUBRICANT OP) Place 1 drop into both eyes daily.    Mar[place] Kitchen denosumab (PROLIA) 60 MG/ML SOSY injection Inject 60 mg into the skin every 6 (six) months.    . loperamide (IMODIUM A-D) 2 MG tablet Take 2 mg by mouth as needed for diarrhea or loose stools.      No results found for this or any previous visit (from the past 48 hour(s)). No results found.  Review of Systems  Musculoskeletal: Positive for back pain and joint pain.  Neurological: Positive for tingling, sensory change and focal weakness.    Blood pressure (!) 211/82, pulse  (!) 54, temperature 97.6 F (36.4 C), temperature source Oral, resp. rate 20, SpO2 100 %. Physical Exam  Constitutional: She is oriented to person, place, and time. She appears well-developed and well-nourished.  HENT:  Head: Normocephalic.  Eyes: Pupils are equal, round, and reactive to light.  Neck: Normal range of motion.  Respiratory: Effort normal.  GI: Soft.  Neurological: She is alert and oriented to person, place, and time. She has normal strength. GCS eye subscore is 4. GCS verbal subscore is 5. GCS motor subscore is 6.  Strength is 4+ out of 5 iliopsoas and quadriceps on the right otherwise 5 out of 5 right lower extremity. Left lower extremities is 5 out of 5 iliopsoas, quads, hip she's, gastric, into tibialis, and EHL.  Skin: Skin is warm and dry.     Assessment/Plan 84 year old female presents for microscopic discectomy L3-4 with decompression of both the L3 nerve root extraforaminally and the L4 nerve root.  Nataleigh Griffin P, MD 01/15/2018, 10:01 AM

## 2018-01-15 NOTE — Anesthesia Postprocedure Evaluation (Signed)
Anesthesia Post Note  Patient: Carly Alexander  Procedure(s) Performed: Microdiscectomy - Lumbar Three-Four - Right (Right Spine Lumbar)     Patient location during evaluation: PACU Anesthesia Type: General Level of consciousness: awake Pain management: pain level controlled Respiratory status: spontaneous breathing Cardiovascular status: stable Anesthetic complications: no    Last Vitals:  Vitals:   01/15/18 1524 01/15/18 1549  BP: (!) 168/74 (!) 155/75  Pulse: (!) 57 (!) 57  Resp: 13 17  Temp: 36.5 C (!) 36.4 C  SpO2: 98% 100%    Last Pain:  Vitals:   01/15/18 1549  TempSrc: Oral  PainSc:                  Taniela Feltus

## 2018-01-15 NOTE — Transfer of Care (Signed)
Immediate Anesthesia Transfer of Care Note  Patient: Carly Alexander  Procedure(s) Performed: Microdiscectomy - Lumbar Three-Four - Right (Right Spine Lumbar)  Patient Location: PACU  Anesthesia Type:General  Level of Consciousness: awake, alert  and oriented  Airway & Oxygen Therapy: Patient Spontanous Breathing and Patient connected to nasal cannula oxygen  Post-op Assessment: Report given to RN, Post -op Vital signs reviewed and stable and Patient moving all extremities X 4  Post vital signs: Reviewed and stable  Last Vitals:  Vitals Value Taken Time  BP 161/83 01/15/2018  1:24 PM  Temp    Pulse 53 01/15/2018  1:29 PM  Resp 13 01/15/2018  1:29 PM  SpO2 100 % 01/15/2018  1:29 PM  Vitals shown include unvalidated device data.  Last Pain:  Vitals:   01/15/18 0914  TempSrc:   PainSc: 8       Patients Stated Pain Goal: 4 (93/26/71 2458)  Complications: No apparent anesthesia complications

## 2018-01-15 NOTE — Anesthesia Preprocedure Evaluation (Addendum)
Anesthesia Evaluation  Patient identified by MRN, date of birth, ID band Patient awake    Reviewed: Allergy & Precautions, NPO status , Patient's Chart, lab work & pertinent test results  Airway Mallampati: II  TM Distance: >3 FB Neck ROM: Full    Dental no notable dental hx. (+) Teeth Intact, Caps   Pulmonary neg pulmonary ROS,    Pulmonary exam normal breath sounds clear to auscultation       Cardiovascular hypertension, Pt. on medications Normal cardiovascular exam+ dysrhythmias Atrial Fibrillation  Rhythm:Regular Rate:Normal  Negative stress echo 2016   Neuro/Psych Parkinson's Diseasenegative neurological ROS     GI/Hepatic negative GI ROS, Neg liver ROS,   Endo/Other  Hyperthyroidism   Renal/GU negative Renal ROS  negative genitourinary   Musculoskeletal negative musculoskeletal ROS (+)   Abdominal   Peds negative pediatric ROS (+)  Hematology negative hematology ROS (+)   Anesthesia Other Findings   Reproductive/Obstetrics negative OB ROS                            Anesthesia Physical Anesthesia Plan  ASA: II  Anesthesia Plan: General   Post-op Pain Management:    Induction: Intravenous  PONV Risk Score and Plan: 3 and Ondansetron and Treatment may vary due to age or medical condition  Airway Management Planned: Oral ETT  Additional Equipment:   Intra-op Plan:   Post-operative Plan: Extubation in OR  Informed Consent: I have reviewed the patients History and Physical, chart, labs and discussed the procedure including the risks, benefits and alternatives for the proposed anesthesia with the patient or authorized representative who has indicated his/her understanding and acceptance.   Dental advisory given  Plan Discussed with: CRNA  Anesthesia Plan Comments:         Anesthesia Quick Evaluation

## 2018-01-15 NOTE — Evaluation (Signed)
Physical Therapy Evaluation Patient Details Name: Carly Alexander MRN: [id number] DOB: [birth year] Today's Date: 01/15/2018   History of Present Illness  Pt is an 84 y/o female s/p L3-4 Microdiscectomy. PMH includes parkinson's disease, HTN, and a fib.   Clinical Impression  Patient is s/p above surgery resulting in the deficits listed below (see PT Problem List). Pt with parkinson's disease, so had shuffle type gait and periods of freezing; required min to min guard A with RW.  Reviewed back precautions and generalized walking program. Per RN, pt's family reports they can provide 24/7 assist. Patient will benefit from skilled PT to increase their independence and safety with mobility (while adhering to their precautions) to allow discharge to the venue listed below.     Follow Up Recommendations Home health PT;Supervision/Assistance - 24 hour    Equipment Recommendations  3in1 (PT)    Recommendations for Other Services       Precautions / Restrictions Precautions Precautions: Back Precaution Booklet Issued: Yes (comment) Precaution Comments: Reviewed back precautions with pt.  Restrictions Weight Bearing Restrictions: No      Mobility  Bed Mobility               General bed mobility comments: Up in bathroom with RN present. Sitting at EOB upon completion of session.   Transfers Overall transfer level: Needs assistance Equipment used: Rolling walker (2 wheeled) Transfers: Sit to/from Stand Sit to Stand: Min guard         General transfer comment: Min guard for safety and steadying.   Ambulation/Gait Ambulation/Gait assistance: Min guard;Min assist Gait Distance (Feet): 50 Feet Assistive device: Rolling walker (2 wheeled) Gait Pattern/deviations: Decreased stride length;Shuffle;Trunk flexed Gait velocity: Decreased    General Gait Details: Slow, unsteady gait. Pt with Parkinsonian gait and had short shuffled steps. Pt with 1-2 instances of freezing  during gait and demonstrated difficulty with turning. Educated about generalized walking program to perform at home.   Stairs            Wheelchair Mobility    Modified Rankin (Stroke Patients Only)       Balance Overall balance assessment: Needs assistance Sitting-balance support: No upper extremity supported;Feet supported Sitting balance-Leahy Scale: Good     Standing balance support: Bilateral upper extremity supported;During functional activity Standing balance-Leahy Scale: Poor Standing balance comment: Reliant on BUE support for balance.                              Pertinent Vitals/Pain Pain Assessment: Faces Faces Pain Scale: Hurts whole lot Pain Location: back Pain Descriptors / Indicators: Aching;Grimacing;Operative site guarding Pain Intervention(s): Limited activity within patient's tolerance;Monitored during session;Repositioned    Home Living Family/patient expects to be discharged to:: Private residence Living Arrangements: Spouse/significant other Available Help at Discharge: Family;Available 24 hours/day Type of Home: House Home Access: Ramped entrance     Home Layout: Multi-level;Able to live on main level with bedroom/bathroom Home Equipment: Walker - 2 wheels;Grab bars - toilet;Grab bars - tub/shower      Prior Function Level of Independence: Needs assistance   Gait / Transfers Assistance Needed: Reports she used RW for ambulation. Per RN, family reports they alway provide close supervision for pt.   ADL's / Homemaking Assistance Needed: Pt reports she is able to bathe and dress independently.         Hand Dominance        Extremity/Trunk Assessment   Upper Extremity Assessment  Upper Extremity Assessment: Defer to OT evaluation    Lower Extremity Assessment Lower Extremity Assessment: Generalized weakness    Cervical / Trunk Assessment Cervical / Trunk Assessment: Kyphotic;Other exceptions Cervical / Trunk  Exceptions: s/p lumbar surgery  Communication   Communication: No difficulties  Cognition Arousal/Alertness: Awake/alert Behavior During Therapy: WFL for tasks assessed/performed Overall Cognitive Status: Within Functional Limits for tasks assessed                                        General Comments      Exercises     Assessment/Plan    PT Assessment Patient needs continued PT services  PT Problem List Decreased strength;Decreased activity tolerance;Decreased balance;Decreased mobility;Decreased knowledge of use of DME;Decreased knowledge of precautions;Pain       PT Treatment Interventions DME instruction;Gait training;Functional mobility training;Therapeutic activities;Therapeutic exercise;Balance training;Patient/family education    PT Goals (Current goals can be found in the Care Plan section)  Acute Rehab PT Goals Patient Stated Goal: to go home tomorrow PT Goal Formulation: With patient Time For Goal Achievement: 01/29/18 Potential to Achieve Goals: Good    Frequency Min 5X/week   Barriers to discharge        Co-evaluation               AM-PAC PT "6 Clicks" Daily Activity  Outcome Measure Difficulty turning over in bed (including adjusting bedclothes, sheets and blankets)?: A Lot Difficulty moving from lying on back to sitting on the side of the bed? : Unable Difficulty sitting down on and standing up from a chair with arms (e.g., wheelchair, bedside commode, etc,.)?: Unable Help needed moving to and from a bed to chair (including a wheelchair)?: A Little Help needed walking in hospital room?: A Little Help needed climbing 3-5 steps with a railing? : A Lot 6 Click Score: 12    End of Session Equipment Utilized During Treatment: Gait belt Activity Tolerance: Patient limited by pain Patient left: in bed;with call bell/phone within reach Nurse Communication: Mobility status PT Visit Diagnosis: Unsteadiness on feet (R26.81);Other  abnormalities of gait and mobility (R26.89);Other symptoms and signs involving the nervous system (R29.898);Muscle weakness (generalized) (M62.81);Pain Pain - part of body: (back )    Time: 1751-1801 PT Time Calculation (min) (ACUTE ONLY): 10 min   Charges:   PT Evaluation $PT Eval Moderate Complexity: 1 Mod     PT G Codes:        Leighton Ruff, PT, DPT  Acute Rehabilitation Services  Pager: [phone]   Rudean Hitt 01/15/2018, 6:11 PM

## 2018-01-15 NOTE — Anesthesia Procedure Notes (Signed)
Procedure Name: Intubation Date/Time: 01/15/2018 10:46 AM Performed by: Kyung Rudd, CRNA Pre-anesthesia Checklist: Patient identified, Emergency Drugs available, Suction available and Patient being monitored Patient Re-evaluated:Patient Re-evaluated prior to induction Oxygen Delivery Method: Circle system utilized Preoxygenation: Pre-oxygenation with 100% oxygen Induction Type: IV induction Ventilation: Mask ventilation without difficulty Laryngoscope Size: Mac and 3 Grade View: Grade I Tube type: Oral Tube size: 7.0 mm Number of attempts: 1 Airway Equipment and Method: Stylet Placement Confirmation: ETT inserted through vocal cords under direct vision,  positive ETCO2 and breath sounds checked- equal and bilateral Secured at: 20 cm Tube secured with: Tape Dental Injury: Teeth and Oropharynx as per pre-operative assessment

## 2018-01-16 ENCOUNTER — Encounter (HOSPITAL_COMMUNITY): Payer: Self-pay | Admitting: Neurosurgery

## 2018-01-16 DIAGNOSIS — M5116 Intervertebral disc disorders with radiculopathy, lumbar region: Secondary | ICD-10-CM | POA: Diagnosis not present

## 2018-01-16 MED ORDER — HYDROCODONE-ACETAMINOPHEN 5-325 MG PO TABS: 1.0000 | ORAL_TABLET | ORAL | 0 refills | Status: AC | PRN

## 2018-01-16 MED ORDER — PROSIGHT PO TABS
1.0000 | ORAL_TABLET | Freq: Every day | ORAL | Status: DC
  Administered 2018-01-16: 1 via ORAL
  Filled 2018-01-16: qty 1

## 2018-01-16 MED ORDER — CYCLOBENZAPRINE HCL 10 MG PO TABS: 10.0000 mg | ORAL_TABLET | Freq: Three times a day (TID) | ORAL | 0 refills | Status: AC | PRN

## 2018-01-16 NOTE — Discharge Summary (Signed)
Physician Discharge Summary  Patient ID: Carly Alexander MRN: [id number] DOB/AGE: 1934/02/20 84 y.o.  Admit date: 01/15/2018 Discharge date: 01/16/2018  Admission Diagnoses: HNP   Discharge Diagnoses: same   Discharged Condition: good  Hospital Course: The patient was admitted on 01/15/2018 and taken to the operating room where the patient underwent microdiskectomy L3-4. The patient tolerated the procedure well and was taken to the recovery room and then to the floor in stable condition. The hospital course was routine. There were no complications. The wound remained clean dry and intact. Pt had appropriate back soreness. No complaints of leg pain or new N/T/W. The patient remained afebrile with stable vital signs, and tolerated a regular diet. The patient continued to increase activities, and pain was well controlled with oral pain medications.   Consults: None  Significant Diagnostic Studies:  Results for orders placed or performed during the hospital encounter of 01/13/18  Surgical pcr screen  Result Value Ref Range   MRSA, PCR NEGATIVE NEGATIVE   Staphylococcus aureus NEGATIVE NEGATIVE  Basic metabolic panel  Result Value Ref Range   Sodium 139 135 - 145 mmol/L   Potassium 4.0 3.5 - 5.1 mmol/L   Chloride 102 98 - 111 mmol/L   CO2 27 22 - 32 mmol/L   Glucose, Bld 115 (H) 70 - 99 mg/dL   BUN 17 8 - 23 mg/dL   Creatinine, Ser 0.81 0.44 - 1.00 mg/dL   Calcium 9.1 8.9 - 10.3 mg/dL   GFR calc non Af Amer >60 >60 mL/min   GFR calc Af Amer >60 >60 mL/min   Anion gap 10 5 - 15  CBC  Result Value Ref Range   WBC 11.1 (H) 4.0 - 10.5 K/uL   RBC 4.66 3.87 - 5.11 MIL/uL   Hemoglobin 14.0 12.0 - 15.0 g/dL   HCT 43.9 36.0 - 46.0 %   MCV 94.2 78.0 - 100.0 fL   MCH 30.0 26.0 - 34.0 pg   MCHC 31.9 30.0 - 36.0 g/dL   RDW 12.8 11.5 - 15.5 %   Platelets 287 150 - 400 K/uL  Protime-INR  Result Value Ref Range   Prothrombin Time 13.7 11.4 - 15.2 seconds   INR 1.06     Dg  Lumbar Spine 2-3 Views  Result Date: 01/15/2018 CLINICAL DATA:  L3-4 microdiskectomy. EXAM: LUMBAR SPINE - 2-3 VIEW COMPARISON:  None. FINDINGS: A portable cross-table lateral view of the lumbar spine demonstrates extensive degenerative changes 4 levels. The number of non-rib-bearing lumbar vertebrae cannot be determined on this view. A 2nd portable lateral view of the lumbar spine demonstrates a metallic localizer overlying the facets in the mid lumbar spine and another localizer slightly more inferiorly. Level the level cannot be determined without previous examinations demonstrating the number of non-rib-bearing lumbar vertebrae. IMPRESSION: Localizers and extensive lumbar spine degenerative changes, as described above. The levels cannot be determined without previous examinations for comparison. Electronically Signed   By: Claudie Revering M.D.   On: 01/15/2018 14:05    Antibiotics:  Anti-infectives (From admission, onward)   Start     Dose/Rate Route Frequency Ordered Stop   01/15/18 1900  ceFAZolin (ANCEF) IVPB 2g/100 mL premix     2 g 200 mL/hr over 30 Minutes Intravenous Every 8 hours 01/15/18 1613 01/16/18 0247   01/15/18 1147  bacitracin 50,000 Units in sodium chloride 0.9 % 500 mL irrigation  Status:  Discontinued       As needed 01/15/18 1148 01/15/18 1319  Discharge Exam: Blood pressure (!) 165/81, pulse (!) 55, temperature 98.7 F (37.1 C), temperature source Oral, resp. rate (!) 1, SpO2 99 %. Neurologic: Grossly normal amblating and voiding well  Discharge Medications:   Allergies as of 01/16/2018      Reactions   Sulfa Antibiotics Rash      Medication List    STOP taking these medications   ELIQUIS 2.5 MG Tabs tablet Generic drug:  apixaban     TAKE these medications   ADVIL PM 200-38 MG Tabs Generic drug:  Ibuprofen-diphenhydrAMINE Cit Take 1 tablet by mouth at bedtime as needed (sleep).   amLODipine 2.5 MG tablet Commonly known as:  NORVASC Take 2.5 mg by  mouth daily.   CALCIUM 600+D 600-400 MG-UNIT tablet Generic drug:  Calcium Carbonate-Vitamin D Take 1 tablet by mouth daily.   carbidopa-levodopa 10-100 MG tablet Commonly known as:  SINEMET IR Take 1 tablet by mouth 3 (three) times daily.   Carbidopa-Levodopa ER 36.25-145 MG Cpcr Take 1 tablet by mouth at bedtime.   cyclobenzaprine 10 MG tablet Commonly known as:  FLEXERIL Take 1 tablet (10 mg total) by mouth 3 (three) times daily as needed for muscle spasms.   denosumab 60 MG/ML Sosy injection Commonly known as:  PROLIA Inject 60 mg into the skin every 6 (six) months.   diclofenac sodium 1 % Gel Commonly known as:  VOLTAREN Apply 1 application topically 4 (four) times daily as needed (pain).   donepezil 10 MG tablet Commonly known as:  ARICEPT Take 10 mg by mouth every evening.   EYE LUBRICANT OP Place 1 drop into both eyes daily.   HYDROcodone-acetaminophen 5-325 MG tablet Commonly known as:  NORCO/VICODIN Take 1 tablet by mouth every 6 (six) hours as needed for moderate pain. What changed:  Another medication with the same name was added. Make sure you understand how and when to take each.   HYDROcodone-acetaminophen 5-325 MG tablet Commonly known as:  NORCO/VICODIN Take 1 tablet by mouth every 4 (four) hours as needed for moderate pain. What changed:  You were already taking a medication with the same name, and this prescription was added. Make sure you understand how and when to take each.   ibuprofen 200 MG tablet Commonly known as:  ADVIL,MOTRIN Take 400 mg by mouth 3 (three) times daily as needed for headache or moderate pain.   loperamide 2 MG tablet Commonly known as:  IMODIUM A-D Take 2 mg by mouth as needed for diarrhea or loose stools.   methimazole 5 MG tablet Commonly known as:  TAPAZOLE Take 5 mg by mouth daily.   metoprolol succinate 25 MG 24 hr tablet Commonly known as:  TOPROL-XL Take 25 mg by mouth 2 (two) times daily.   multivitamin with  minerals Tabs tablet Take 1 tablet by mouth daily.   PRESERVISION AREDS 2 Caps Take 1 capsule by mouth daily.   Probiotic Caps Take 1 capsule by mouth daily.   sertraline 25 MG tablet Commonly known as:  ZOLOFT Take 25 mg by mouth daily.   traMADol 50 MG tablet Commonly known as:  ULTRAM Take 50 mg by mouth 4 (four) times daily as needed for pain.       Disposition: home   Final Dx: microdiskectomy L3-4  Discharge Instructions     Remove dressing in 72 hours   Complete by:  As directed    Call MD for:  difficulty breathing, headache or visual disturbances   Complete by:  As directed  Call MD for:  extreme fatigue   Complete by:  As directed    Call MD for:  hives   Complete by:  As directed    Call MD for:  persistant dizziness or light-headedness   Complete by:  As directed    Call MD for:  persistant nausea and vomiting   Complete by:  As directed    Call MD for:  redness, tenderness, or signs of infection (pain, swelling, redness, odor or green/yellow discharge around incision site)   Complete by:  As directed    Call MD for:  severe uncontrolled pain   Complete by:  As directed    Call MD for:  temperature >100.4   Complete by:  As directed    Diet - low sodium heart healthy   Complete by:  As directed    Driving Restrictions   Complete by:  As directed    No driving 2 weeks   Increase activity slowly   Complete by:  As directed    Lifting restrictions   Complete by:  As directed    No lifting anything heavier than 8 lbs      Follow-up Information    Kary Kos, MD. Schedule an appointment as soon as possible for a visit in 2 week(s).   Specialty:  Neurosurgery Contact information: [address] [place] [address] [place] [address] [phone]            Signed: Ocie Cornfield [facility] 01/16/2018, 11:24 AM

## 2018-01-16 NOTE — Evaluation (Signed)
Occupational Therapy Evaluation Patient Details Name: Carly Alexander MRN: [id number] DOB: 08/10/1933 Today's Date: 01/16/2018    History of Present Illness Pt is an 84 y/o female s/p L3-4 Microdiscectomy. PMH includes parkinson's disease, HTN, and a fib.    Clinical Impression   Pt reports she was mod I with ADL PTA. Currently pt requires min guard assist for functional mobility and mod assist overall for ADL. Pt reports her husband will be assisting her with all ADL as needed. Began back and safety education with pt; treatment limited today due to pain. Pt able to recall 1/3 back precautions at end of session. Pt planning to d/c home with 24/7 supervision from family. Recommending HHOT for follow up to maximize independence and safety with ADL and functional mobility upon return home. Pt would benefit from continued skilled OT to address established goals.    Follow Up Recommendations  Home health OT;Supervision/Assistance - 24 hour    Equipment Recommendations  None recommended by OT    Recommendations for Other Services       Precautions / Restrictions Precautions Precautions: Back Precaution Booklet Issued: No Precaution Comments: Reviewed back precautions with pt, she was able to recall 1/3 at end of session Restrictions Weight Bearing Restrictions: No      Mobility Bed Mobility Overal bed mobility: Needs Assistance Bed Mobility: Rolling;Sidelying to Sit;Sit to Sidelying Rolling: Supervision Sidelying to sit: Min guard    Sit to sidelying: Min guard General bed mobility comments: Cues throughout for log roll technique. Increased time and effort  Transfers Overall transfer level: Needs assistance Equipment used: Rolling walker (2 wheeled) Transfers: Sit to/from Stand Sit to Stand: Min guard         General transfer comment: Cues for hand placement, min guard for safety. Increased time and effort to perform    Balance Overall balance assessment: Needs  assistance Sitting-balance support: Feet supported Sitting balance-Leahy Scale: Good     Standing balance support: Bilateral upper extremity supported Standing balance-Leahy Scale: Poor Standing balance comment: RW for support                           ADL either performed or assessed with clinical judgement   ADL Overall ADL's : Needs assistance/impaired Eating/Feeding: Set up;Sitting   Grooming: Set up;Supervision/safety;Sitting   Upper Body Bathing: Moderate assistance;Sitting   Lower Body Bathing: Moderate assistance;Sit to/from stand   Upper Body Dressing : Moderate assistance;Sitting   Lower Body Dressing: Moderate assistance;Sit to/from stand   Toilet Transfer: Min guard;Ambulation;RW Toilet Transfer Details (indicate cue type and reason): Simulated by sit to stand from EOB with functional mobility in room         Functional mobility during ADLs: Min guard;Rolling walker General ADL Comments: Pt c/o significant pain and not tolerating much mobility at this point. Educated pt on importance of mobility throghout the day. Pt reports he husband will assist with all ADL upon return home.     Vision         Perception     Praxis      Pertinent Vitals/Pain Pain Assessment: Faces Faces Pain Scale: Hurts whole lot Pain Location: back Pain Descriptors / Indicators: Aching;Sore Pain Intervention(s): Monitored during session;Limited activity within patient's tolerance;Repositioned;Patient requesting pain meds-RN notified     Hand Dominance     Extremity/Trunk Assessment Upper Extremity Assessment Upper Extremity Assessment: Generalized weakness   Lower Extremity Assessment Lower Extremity Assessment: Defer to PT evaluation  Cervical / Trunk Assessment Cervical / Trunk Assessment: Kyphotic;Other exceptions Cervical / Trunk Exceptions: s/p lumbar surgery   Communication Communication Communication: No difficulties   Cognition Arousal/Alertness:  Awake/alert Behavior During Therapy: WFL for tasks assessed/performed Overall Cognitive Status: Within Functional Limits for tasks assessed                                     General Comments       Exercises    Shoulder Instructions      Home Living Family/patient expects to be discharged to:: Private residence Living Arrangements: Spouse/significant other Available Help at Discharge: Family;Available 24 hours/day Type of Home: House Home Access: Ramped entrance     Home Layout: Multi-level;Able to live on main level with bedroom/bathroom     Bathroom Shower/Tub: Walk-in shower   Bathroom Toilet: Handicapped height     Home Equipment: Walker - 2 wheels;Grab bars - toilet;Grab bars - tub/shower;Shower seat - built in;Bedside commode          Prior Functioning/Environment Level of Independence: Needs assistance  Gait / Transfers Assistance Needed: Reports she used RW for ambulation. Per RN, family reports they alway provide close supervision for pt.  ADL's / Homemaking Assistance Needed: Pt reports she is able to bathe and dress independently.             OT Problem List: Decreased strength;Decreased activity tolerance;Impaired balance (sitting and/or standing);Decreased knowledge of use of DME or AE;Decreased knowledge of precautions;Pain      OT Treatment/Interventions: Self-care/ADL training;Energy conservation;DME and/or AE instruction;Therapeutic activities;Patient/family education;Balance training    OT Goals(Current goals can be found in the care plan section) Acute Rehab OT Goals Patient Stated Goal: decrease pain OT Goal Formulation: With patient Time For Goal Achievement: 01/30/18 Potential to Achieve Goals: Good ADL Goals Pt Will Transfer to Toilet: with supervision;ambulating;bedside commode Pt Will Perform Toileting - Clothing Manipulation and hygiene: with supervision;sit to/from stand Additional ADL Goal #1: Pt will independently  verbally recall 3/3 back precautions and maintain throughout ADL. Additional ADL Goal #2: Pt will perform log roll for bed mobility with supervision and no cues.  OT Frequency: Min 2X/week   Barriers to D/C:            Co-evaluation              AM-PAC PT "6 Clicks" Daily Activity     Outcome Measure Help from another person eating meals?: A Little Help from another person taking care of personal grooming?: A Little Help from another person toileting, which includes using toliet, bedpan, or urinal?: A Lot Help from another person bathing (including washing, rinsing, drying)?: A Lot Help from another person to put on and taking off regular upper body clothing?: A Lot Help from another person to put on and taking off regular lower body clothing?: A Lot 6 Click Score: 14   End of Session Equipment Utilized During Treatment: Rolling walker Nurse Communication: Mobility status;Patient requests pain meds;Other (comment)(f/u needs)  Activity Tolerance: Patient limited by pain Patient left: in bed;with call bell/phone within reach  OT Visit Diagnosis: Unsteadiness on feet (R26.81);Pain Pain - part of body: (back)                Time: 2595-6387 OT Time Calculation (min): 10 min Charges:  OT General Charges $OT Visit: 1 Visit OT Evaluation $OT Eval Moderate Complexity: 1 Mod  Jantz Main A. Ulice Brilliant, M.S., OTR/L  Acute Rehab Department: [phone]  Binnie Kand 01/16/2018, 11:59 AM

## 2018-01-16 NOTE — Progress Notes (Signed)
Patient alert and oriented, mae's well, voiding adequate amount of urine, swallowing without difficulty, no c/o pain at time of discharge. Patient discharged home with family. Script and discharged instructions given to patient. Patient and family stated understanding of instructions given. Patient has an appointment with Dr. Cram 

## 2018-01-16 NOTE — Progress Notes (Signed)
Physical Therapy Treatment Patient Details Name: Carly Alexander MRN: [id number] DOB: Apr 17, 1934 Today's Date: 01/16/2018    History of Present Illness Pt is an 84 y/o female s/p L3-4 Microdiscectomy. PMH includes parkinson's disease, HTN, and a fib.     PT Comments    Patient treatment limited by patient back pain and fatigue. Only ambulating 8 feet with walker and min guard assist in room. Suspect patient will progress mobility with familiarity of home and use of Rollator which she uses at baseline. Pt husband able to provide 24/7 support. Current d/c plan remains appropriate.    Follow Up Recommendations  Home health PT;Supervision/Assistance - 24 hour     Equipment Recommendations  3in1 (PT)    Recommendations for Other Services       Precautions / Restrictions Precautions Precautions: Back Precaution Booklet Issued: Yes (comment) Precaution Comments: Reviewed back precautions with pt.  Restrictions Weight Bearing Restrictions: No    Mobility  Bed Mobility Overal bed mobility: Needs Assistance Bed Mobility: Supine to Sit     Supine to sit: Min guard     General bed mobility comments: increased time and effort to elevate legs onto lowered bed height  Transfers Overall transfer level: Needs assistance Equipment used: Rolling walker (2 wheeled) Transfers: Sit to/from Stand Sit to Stand: Min guard         General transfer comment: Min guard for safety and steadying.   Ambulation/Gait Ambulation/Gait assistance: Min guard Gait Distance (Feet): 8 Feet Assistive device: Rolling walker (2 wheeled) Gait Pattern/deviations: Decreased stride length;Shuffle;Trunk flexed Gait velocity: Decreased  Gait velocity interpretation: <1.31 ft/sec, indicative of household ambulator General Gait Details: Slow, unsteady gait. Pt with short, shuffling steps with decreased stride length. Pt with 1-2 instances of freezing during gait and demonstrated difficulty with  turning. Benefitted for auditory cue of "step," to initiate motion. Distance limited by patient stating, "I'm just going to give out, I have to lay down."   Stairs             Wheelchair Mobility    Modified Rankin (Stroke Patients Only)       Balance Overall balance assessment: Needs assistance Sitting-balance support: No upper extremity supported;Feet supported Sitting balance-Leahy Scale: Good     Standing balance support: Bilateral upper extremity supported;During functional activity Standing balance-Leahy Scale: Poor Standing balance comment: Reliant on BUE support for balance.                             Cognition Arousal/Alertness: Awake/alert Behavior During Therapy: WFL for tasks assessed/performed Overall Cognitive Status: Within Functional Limits for tasks assessed                                        Exercises Other Exercises Other Exercises: Bilateral scapular retractions x 10    General Comments        Pertinent Vitals/Pain Pain Assessment: Faces Faces Pain Scale: Hurts whole lot Pain Location: back Pain Descriptors / Indicators: Aching;Grimacing;Operative site guarding Pain Intervention(s): Limited activity within patient's tolerance;Monitored during session;Premedicated before session    Home Living                      Prior Function            PT Goals (current goals can now be found in the care plan section)  Acute Rehab PT Goals Patient Stated Goal: to go home tomorrow PT Goal Formulation: With patient Time For Goal Achievement: 01/29/18 Potential to Achieve Goals: Good    Frequency    Min 5X/week      PT Plan Current plan remains appropriate    Co-evaluation              AM-PAC PT "6 Clicks" Daily Activity  Outcome Measure  Difficulty turning over in bed (including adjusting bedclothes, sheets and blankets)?: A Lot Difficulty moving from lying on back to sitting on the side  of the bed? : Unable Difficulty sitting down on and standing up from a chair with arms (e.g., wheelchair, bedside commode, etc,.)?: Unable Help needed moving to and from a bed to chair (including a wheelchair)?: A Little Help needed walking in hospital room?: A Little Help needed climbing 3-5 steps with a railing? : A Lot 6 Click Score: 12    End of Session Equipment Utilized During Treatment: Gait belt Activity Tolerance: Patient limited by pain;Patient limited by fatigue Patient left: in bed;with call bell/phone within reach Nurse Communication: Mobility status PT Visit Diagnosis: Unsteadiness on feet (R26.81);Other abnormalities of gait and mobility (R26.89);Other symptoms and signs involving the nervous system (R29.898);Muscle weakness (generalized) (M62.81);Pain Pain - part of body: (back )     Time: 6244-6950 PT Time Calculation (min) (ACUTE ONLY): 12 min  Charges:  $Therapeutic Activity: 8-22 mins                     Ellamae Sia, PT, DPT Acute Rehabilitation Services  Pager: [phone]    Willy Eddy 01/16/2018, 11:49 AM

## 2018-01-16 NOTE — Care Management Note (Signed)
Case Management Note  Patient Details  Name: Allyiah Gartner MRN: [id number] Date of Birth: Apr 08, 1934  Subjective/Objective:  Patient is 84 yr old female s/p L3-$ Microdiscectomy.                    Action/Plan: Case manager spoke with patient's husband concerning discharge plan. Choice for Home Health Agency was offered. Referral was called to Neoma Laming, [place] Liaison.   Expected Discharge Date:  01/16/18               Expected Discharge Plan:  [place]  In-House Referral:  NA  Discharge planning Services  CM Consult  Post Acute Care Choice:  Home Health Choice offered to:  Spouse  DME Arranged:  N/A DME Agency:  NA  HH Arranged:  PT, OT HH Agency:  [facility]  Status of Service:  Completed, signed off  If discussed at [place] of Stay Meetings, dates discussed:    Additional Comments:  Ninfa Meeker, RN 01/16/2018, 12:22 PM

## 2018-01-16 NOTE — Progress Notes (Signed)
Subjective: Patient reports Doing well no leg pain condition back pain  Objective: Vital signs in last 24 hours: Temp:  [97.5 F (36.4 C)-98.7 F (37.1 C)] 98.7 F (37.1 C) (07/25 0740) Pulse Rate:  [54-69] 55 (07/25 0740) Resp:  [1-20] 1 (07/25 0740) BP: (121-211)/(61-86) 165/81 (07/25 0740) SpO2:  [97 %-100 %] 99 % (07/25 0740)  Intake/Output from previous day: 07/24 0701 - 07/25 0700 In: 1152.6 [I.V.:952.6; IV Piggyback:200] Out: 100 [Blood:100] Intake/Output this shift: No intake/output data recorded.  Strength stable some slight quadriceps and iliopsoas weakness in the right leg incision clean dry and intact  Lab Results: Recent Labs    01/13/18 0858  WBC 11.1*  HGB 14.0  HCT 43.9  PLT 287   BMET Recent Labs    01/13/18 0858  NA 139  K 4.0  CL 102  CO2 27  GLUCOSE 115*  BUN 17  CREATININE 0.81  CALCIUM 9.1    Studies/Results: Dg Lumbar Spine 2-3 Views  Result Date: 01/15/2018 CLINICAL DATA:  L3-4 microdiskectomy. EXAM: LUMBAR SPINE - 2-3 VIEW COMPARISON:  None. FINDINGS: A portable cross-table lateral view of the lumbar spine demonstrates extensive degenerative changes 4 levels. The number of non-rib-bearing lumbar vertebrae cannot be determined on this view. A 2nd portable lateral view of the lumbar spine demonstrates a metallic localizer overlying the facets in the mid lumbar spine and another localizer slightly more inferiorly. Level the level cannot be determined without previous examinations demonstrating the number of non-rib-bearing lumbar vertebrae. IMPRESSION: Localizers and extensive lumbar spine degenerative changes, as described above. The levels cannot be determined without previous examinations for comparison. Electronically Signed   By: Claudie Revering M.D.   On: 01/15/2018 14:05    Assessment/Plan: Mobilize with physical therapy this morning to assess what she might need for home but can be discharged later today. I would recommend she remain off  of her eliquis for 72 hours postop resume on Saturday.  LOS: 0 days     Whitlee Sluder P 01/16/2018, 7:46 AM

## 2018-02-05 NOTE — Op Note (Signed)
Preoperative diagnosis: Right L4 radiculopathy from large  Herniated nucleus pulposis L4-5 on the right.  Postoperative diagnosis: Same  Procedure:lumbar laminectomy microdiscectomy L4-5 on the right with extra foraminal and interforaminal discectomy with microdissection of the L4 and L5 nerve roots and microscopic discectomy  Surgeon: Dominica Severin Mattison Stuckey  Anesthesia: Gen.  EBL: Minimal   History of present illness: 84 year old female with back and right L4 radicular symptom with some component of L5 imaging findings showing a large disc herniation at L4-5 with a fragment extending extra foraminally as well as a free fragment in the canal. Due to the patient's failure of conservative treatment progression of clinical syndrome and imaging findings I recommended microdiscectomy at this level. I extensively reviewed the risks and benefits of the operation with the patient as well as perioperative course expectations of outcome and alternatives to surgery and she understood and agreed to proceed forward.  Operative procedure: Patient was brought into the OR was induced under general anesthesia positioned prone the Wilson frame her back was prepped and draped in routine sterile fashion. Preoperative x-ray localized the appropriate level and after infiltration of 10 mL lidocaine with epi a midline incision was made. Subperiosteal dissection was carried out laminotomy was begun with a high-speed drill as well as interoperative x-ray confirming the appropriate level extra foraminal decompression was also carrried out. Under microscopic illumination a identified the inner transverse ligament removed this in piecemeal fashion identify the L4 nerve root removed a large free fragment disc herniation with significantlydecompressed the L4 nerve root. Then working within the canal I removed some additional free fragme performed discectomy. Removed extensive amount of disc material from underneath the facet joint working from  inside and outside the ca preserving as much pars interarticularis as I could. At the end of the discectomy there was no further stenosis or compression of the right L4 or L5 nerve root. The wound was then copiously irrigate meticulous hemostasis was maintained and the wound was closed in layers with interrupted Vicryl running 4 subcuticular Dermabond and swelling Steri-Strips and sterile dressing was applied patient went to recovery room in stable condition all needle counts and sponge counts  Were correct.

## 2019-07-08 ENCOUNTER — Encounter (HOSPITAL_BASED_OUTPATIENT_CLINIC_OR_DEPARTMENT_OTHER): Payer: Self-pay

## 2019-07-08 ENCOUNTER — Other Ambulatory Visit: Payer: Self-pay

## 2019-07-08 ENCOUNTER — Emergency Department (HOSPITAL_BASED_OUTPATIENT_CLINIC_OR_DEPARTMENT_OTHER): Payer: Medicare Other

## 2019-07-08 ENCOUNTER — Emergency Department (HOSPITAL_BASED_OUTPATIENT_CLINIC_OR_DEPARTMENT_OTHER)
Admission: EM | Admit: 2019-07-08 | Discharge: 2019-07-09 | Disposition: A | Discharge status: Home / Self Care | Payer: Medicare Other | Attending: Emergency Medicine | Admitting: Emergency Medicine

## 2019-07-08 DIAGNOSIS — G20 Parkinson's disease: Secondary | ICD-10-CM | POA: Insufficient documentation

## 2019-07-08 DIAGNOSIS — Z79899 Other long term (current) drug therapy: Secondary | ICD-10-CM | POA: Insufficient documentation

## 2019-07-08 DIAGNOSIS — I10 Essential (primary) hypertension: Secondary | ICD-10-CM | POA: Diagnosis not present

## 2019-07-08 DIAGNOSIS — F0280 Dementia in other diseases classified elsewhere without behavioral disturbance: Secondary | ICD-10-CM | POA: Insufficient documentation

## 2019-07-08 DIAGNOSIS — R531 Weakness: Secondary | ICD-10-CM | POA: Diagnosis not present

## 2019-07-08 DIAGNOSIS — R4182 Altered mental status, unspecified: Secondary | ICD-10-CM | POA: Diagnosis present

## 2019-07-08 HISTORY — DX: Unspecified dementia, unspecified severity, without behavioral disturbance, psychotic disturbance, mood disturbance, and anxiety: F03.90

## 2019-07-08 LAB — CBC WITH DIFFERENTIAL/PLATELET
Abs Immature Granulocytes: 0.05 10*3/uL (ref 0.00–0.07)
Basophils Absolute: 0 10*3/uL (ref 0.0–0.1)
Basophils Relative: 0 %
Eosinophils Absolute: 0 10*3/uL (ref 0.0–0.5)
Eosinophils Relative: 0 %
HCT: 38 % (ref 36.0–46.0)
Hemoglobin: 12.5 g/dL (ref 12.0–15.0)
Immature Granulocytes: 1 %
Lymphocytes Relative: 8 %
Lymphs Abs: 0.8 10*3/uL (ref 0.7–4.0)
MCH: 30.3 pg (ref 26.0–34.0)
MCHC: 32.9 g/dL (ref 30.0–36.0)
MCV: 92.2 fL (ref 80.0–100.0)
Monocytes Absolute: 0.8 10*3/uL (ref 0.1–1.0)
Monocytes Relative: 8 %
Neutro Abs: 8.6 10*3/uL — ABNORMAL HIGH (ref 1.7–7.7)
Neutrophils Relative %: 83 %
Platelets: 244 10*3/uL (ref 150–400)
RBC: 4.12 MIL/uL (ref 3.87–5.11)
RDW: 12.8 % (ref 11.5–15.5)
WBC: 10.4 10*3/uL (ref 4.0–10.5)
nRBC: 0 % (ref 0.0–0.2)

## 2019-07-08 LAB — URINALYSIS, ROUTINE W REFLEX MICROSCOPIC
Bilirubin Urine: NEGATIVE
Glucose, UA: NEGATIVE mg/dL
Hgb urine dipstick: NEGATIVE
Ketones, ur: NEGATIVE mg/dL
Leukocytes,Ua: NEGATIVE
Nitrite: NEGATIVE
Protein, ur: NEGATIVE mg/dL
Specific Gravity, Urine: 1.02 (ref 1.005–1.030)
pH: 7.5 (ref 5.0–8.0)

## 2019-07-08 LAB — BASIC METABOLIC PANEL
Anion gap: 9 (ref 5–15)
BUN: 17 mg/dL (ref 8–23)
CO2: 26 mmol/L (ref 22–32)
Calcium: 9.2 mg/dL (ref 8.9–10.3)
Chloride: 104 mmol/L (ref 98–111)
Creatinine, Ser: 0.9 mg/dL (ref 0.44–1.00)
GFR calc Af Amer: >60 mL/min (ref >60)
GFR calc non Af Amer: 59 mL/min — ABNORMAL LOW (ref >60)
Glucose, Bld: 136 mg/dL — ABNORMAL HIGH (ref 70–99)
Potassium: 3.5 mmol/L (ref 3.5–5.1)
Sodium: 139 mmol/L (ref 135–145)

## 2019-07-08 LAB — TROPONIN I (HIGH SENSITIVITY): Troponin I (High Sensitivity): 9 ng/L (ref <18)

## 2019-07-08 NOTE — ED Provider Notes (Signed)
[facility] Provider Note   CSN: [id number] Arrival date & time: 07/08/19  2106     History Chief Complaint  Patient presents with  . Altered Mental Status    Carly Alexander is a 86 y.o. female with a history of advanced Parkinson's disease, dementia, A. fib on Eliquis, hypothyroidism, presenting to the emergency department with confusion and weakness.  Patient has no acute complaints and the history is largely provided by her husband, Lake Bells, a retired Engineer, drilling.  He reports that the patient has been in chronic progressive decline for the past however several weeks to months.  However today there is no acute change in her behavior.  He noted she had diminished appetite earlier in the morning.  He also states that she typically tends to drift to her left side when walking but today was referring towards her right.  He says that at a certain point this afternoon she lost her footing due to a parkinsonian shuffle and fell in the doorway.  She did not hit her head on anything or lose consciousness.  He states that later in the afternoon she was very weak and unable to get in and out of her chair up and down into the bed.  He also reports that she had an episode of urinary incontinence today which is extremely unusual for her.  He reports she does not have a history of recurrent UTIs and has not had a urine infection in a while.  No fevers, chills, cough, congestion recently.  No hx of stroke or TIA  HPI     Past Medical History:  Diagnosis Date  . Arthritis   . 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 Dementia ([place])   . Depression    on zoloft  . Dysrhythmia    Atrial fibrilliation  . Hypertension   . Hyperthyroidism   . Parkinson disease [facility])    Neurologist Dr. Kyra Searles [facility])    Patient Active Problem List   Diagnosis Date Noted  . HNP (herniated nucleus pulposus), lumbar 01/15/2018    Past Surgical History:  Procedure  Laterality Date  . COLONOSCOPY    . excision of skin cancer    . FACIAL COSMETIC SURGERY    . LUMBAR LAMINECTOMY/DECOMPRESSION MICRODISCECTOMY Right 01/15/2018   Procedure: Microdiscectomy - Lumbar Three-Four - Right;  Surgeon: Kary Kos, MD;  Location: [facility];  Service: Neurosurgery;  Laterality: Right;     OB History   No obstetric history on file.     No family history on file.  Social History   Tobacco Use  . Smoking status: Never Smoker  . Smokeless tobacco: Never Used  Substance Use Topics  . Alcohol use: Not Currently  . Drug use: Not Currently    Home Medications Prior to Admission medications   Medication Sig Start Date End Date Taking? Authorizing Provider  amLODipine (NORVASC) 2.5 MG tablet Take 2.5 mg by mouth daily. 12/14/17   [provider]  Calcium Carbonate-Vitamin D (CALCIUM 600+D) 600-400 MG-UNIT tablet Take 1 tablet by mouth daily.    [provider]  carbidopa-levodopa (SINEMET IR) 10-100 MG tablet Take 1 tablet by mouth 3 (three) times daily.    [provider]  Carbidopa-Levodopa ER 36.25-145 MG CPCR Take 1 tablet by mouth at bedtime.    [provider]  cyclobenzaprine (FLEXERIL) 10 MG tablet Take 1 tablet (10 mg total) by mouth 3 (three) times daily as needed for  muscle spasms. 01/16/18   Meyran, Ocie Cornfield, NP  denosumab (PROLIA) 60 MG/ML SOSY injection Inject 60 mg into the skin every 6 (six) months.    [provider]  diclofenac sodium (VOLTAREN) 1 % GEL Apply 1 application topically 4 (four) times daily as needed (pain).    [provider]  donepezil (ARICEPT) 10 MG tablet Take 10 mg by mouth every evening.    [provider]  HYDROcodone-acetaminophen (NORCO/VICODIN) 5-325 MG tablet Take 1 tablet by mouth every 6 (six) hours as needed for moderate pain.    [provider]  ibuprofen (ADVIL,MOTRIN) 200 MG tablet Take 400 mg by mouth 3 (three) times daily as needed for headache  or moderate pain.    [provider]  Ibuprofen-diphenhydrAMINE Cit (ADVIL PM) 200-38 MG TABS Take 1 tablet by mouth at bedtime as needed (sleep).    [provider]  loperamide (IMODIUM A-D) 2 MG tablet Take 2 mg by mouth as needed for diarrhea or loose stools.    [provider]  methimazole (TAPAZOLE) 5 MG tablet Take 5 mg by mouth daily.    [provider]  metoprolol succinate (TOPROL-XL) 25 MG 24 hr tablet Take 25 mg by mouth 2 (two) times daily.    [provider]  Multiple Vitamin (MULTIVITAMIN WITH MINERALS) TABS tablet Take 1 tablet by mouth daily.    [provider]  Multiple Vitamins-Minerals (PRESERVISION AREDS 2) CAPS Take 1 capsule by mouth daily.    [provider]  Probiotic CAPS Take 1 capsule by mouth daily.    [provider]  sertraline (ZOLOFT) 25 MG tablet Take 25 mg by mouth daily. 12/14/17   [provider]  traMADol (ULTRAM) 50 MG tablet Take 50 mg by mouth 4 (four) times daily as needed for pain. 01/07/18   [provider]  White Petrolatum-Mineral Oil (EYE LUBRICANT OP) Place 1 drop into both eyes daily.    [provider]    Allergies    Sulfa antibiotics  Review of Systems   Review of Systems  Constitutional: Negative for chills and fever.  Eyes: Negative for photophobia and visual disturbance.  Respiratory: Negative for cough and shortness of breath.   Cardiovascular: Negative for chest pain and palpitations.  Gastrointestinal: Negative for abdominal pain, nausea and vomiting.  Genitourinary: Negative for dysuria and hematuria.  Musculoskeletal: Negative for arthralgias and myalgias.  Skin: Negative for pallor and rash.  Neurological: Negative for syncope, light-headedness and headaches.  All other systems reviewed and are negative.   Physical Exam Updated Vital Signs BP (!) 142/79 (BP Location: Left Arm)   Pulse (!) 51   Temp 98.1 F (36.7 C) (Oral)    Resp 14   Ht 5\' 3"  (1.6 m)   Wt 44.5 kg   SpO2 97%   BMI 17.36 kg/m   Physical Exam Vitals and nursing note reviewed.  Constitutional:      General: She is not in acute distress.    Appearance: She is well-developed.     Comments: Thin  HENT:     Head: Normocephalic and atraumatic.  Eyes:     Conjunctiva/sclera: Conjunctivae normal.  Cardiovascular:     Rate and Rhythm: Normal rate and regular rhythm.     Pulses: Normal pulses.  Pulmonary:     Effort: Pulmonary effort is normal. No respiratory distress.     Breath sounds: Normal breath sounds.  Abdominal:     General: There is no distension.  Palpations: Abdomen is soft.     Tenderness: There is no abdominal tenderness.  Musculoskeletal:        General: No swelling or tenderness.     Cervical back: Neck supple.  Skin:    General: Skin is warm and dry.  Neurological:     General: No focal deficit present.     Mental Status: She is alert and oriented to person, place, and time.     GCS: GCS eye subscore is 4. GCS verbal subscore is 5. GCS motor subscore is 6.     Cranial Nerves: No cranial nerve deficit.     Sensory: No sensory deficit.     Motor: Motor function is intact.     Coordination: Coordination is intact. Finger-Nose-Finger Test normal.     Comments: Mild resting tremor  Psychiatric:        Mood and Affect: Mood normal.        Behavior: Behavior normal.     ED Results / Procedures / Treatments   Labs (all labs ordered are listed, but only abnormal results are displayed) Labs Reviewed  CBC WITH DIFFERENTIAL/PLATELET - Abnormal; Notable for the following components:      Result Value   Neutro Abs 8.6 (*)    All other components within normal limits  BASIC METABOLIC PANEL - Abnormal; Notable for the following components:   Glucose, Bld 136 (*)    GFR calc non Af Amer 59 (*)    All other components within normal limits  URINALYSIS, ROUTINE W REFLEX MICROSCOPIC - Abnormal; Notable for the following  components:   APPearance HAZY (*)    All other components within normal limits  TROPONIN I (HIGH SENSITIVITY)  TROPONIN I (HIGH SENSITIVITY)    EKG EKG Interpretation  Date/Time:  Wednesday July 08 2019 22:25:56 EST Ventricular Rate:  63 PR Interval:    QRS Duration: 96 QT Interval:  504 QTC Calculation: 516 R Axis:   31 Text Interpretation: Sinus rhythm LVH with secondary repolarization abnormality Prolonged QT interval No STEMI Confirmed by Octaviano Glow [phone]) on 07/08/2019 10:50:24 PM   Radiology CT Head Wo Contrast  Result Date: 07/08/2019 CLINICAL DATA:  Head trauma EXAM: CT HEAD WITHOUT CONTRAST TECHNIQUE: Contiguous axial images were obtained from the base of the skull through the vertex without intravenous contrast. COMPARISON:  None. FINDINGS: Brain: No evidence of acute territorial infarction, hemorrhage, hydrocephalus,extra-axial collection or mass lesion/mass effect. There is dilatation the ventricles and sulci consistent with age-related atrophy. Low-attenuation changes in the deep white matter consistent with small vessel ischemia. Vascular: No hyperdense vessel or unexpected calcification. Skull: The skull is intact. No fracture or focal lesion identified. Sinuses/Orbits: The visualized paranasal sinuses and mastoid air cells are clear. The orbits and globes intact. Other: None IMPRESSION: No acute intracranial abnormality. Findings consistent with age related atrophy and chronic small vessel ischemia Electronically Signed   By: Prudencio Pair M.D.   On: 07/08/2019 22:24    Procedures Procedures (including critical care time)  Medications Ordered in ED Medications - No data to display  ED Course  I have reviewed the triage vital signs and the nursing notes.  Pertinent labs & imaging results that were available during my care of the patient were reviewed by me and considered in my medical decision making (see chart for details).  This is an 86 year old female  with a history of Parkinson's dementia presenting to the emergency department with reported concerns for weakness and urinary incontinence.  Her husband says  that she has been a progressive decline for some time but today seemed particularly weak, at times dazed, and had an episode of incontinence.  The patient has no acute complaints on exam.  She is thin.    Plan to obtain cardiac workup with ecg, troponin Infectious w/u with UA, chest xray - low suspicion for COVID-19 or sepsis CTH given fall on eliquis (no evidence of trauma on exam) Check hgb and electrolytes as well  Clinical Course as of Jul 07 2345  Wed Jul 08, 2019  2250  IMPRESSION: No acute intracranial abnormality.  Findings consistent with age related atrophy and chronic small vessel ischemia   [MT]    Clinical Course User Index [MT] Fionn Stracke, Carola Rhine, MD    Final Clinical Impression(s) / ED Diagnoses Final diagnoses:  Weakness    Rx / DC Orders ED Discharge Orders    None       Wyvonnia Dusky, MD 07/08/19 [phone]

## 2019-07-08 NOTE — ED Notes (Signed)
UA collected, sent to lab.  

## 2019-07-08 NOTE — ED Triage Notes (Addendum)
Pt states pt with decreased appetite and confusion since 12pm and pt has "chronic list to the left but today it's been to the right"-pt fell ~ 630pm with hx of multiple recent falls-husband states pt had a ?dementia dx in Dec 2020-pt to triage in own rollator-pt alert to name and DOB-reoriented to date and age-NAD

## 2019-10-20 ENCOUNTER — Emergency Department (HOSPITAL_BASED_OUTPATIENT_CLINIC_OR_DEPARTMENT_OTHER): Payer: Medicare Other

## 2019-10-20 ENCOUNTER — Other Ambulatory Visit: Payer: Self-pay

## 2019-10-20 ENCOUNTER — Emergency Department (HOSPITAL_BASED_OUTPATIENT_CLINIC_OR_DEPARTMENT_OTHER)
Admission: EM | Admit: 2019-10-20 | Discharge: 2019-10-21 | Disposition: A | Discharge status: Home / Self Care | Payer: Medicare Other | Attending: Emergency Medicine | Admitting: Emergency Medicine

## 2019-10-20 DIAGNOSIS — Z79899 Other long term (current) drug therapy: Secondary | ICD-10-CM | POA: Diagnosis not present

## 2019-10-20 DIAGNOSIS — Y929 Unspecified place or not applicable: Secondary | ICD-10-CM | POA: Insufficient documentation

## 2019-10-20 DIAGNOSIS — Z7901 Long term (current) use of anticoagulants: Secondary | ICD-10-CM | POA: Insufficient documentation

## 2019-10-20 DIAGNOSIS — W19XXXA Unspecified fall, initial encounter: Secondary | ICD-10-CM | POA: Diagnosis not present

## 2019-10-20 DIAGNOSIS — S01312A Laceration without foreign body of left ear, initial encounter: Secondary | ICD-10-CM

## 2019-10-20 DIAGNOSIS — Y999 Unspecified external cause status: Secondary | ICD-10-CM | POA: Diagnosis not present

## 2019-10-20 DIAGNOSIS — Y9301 Activity, walking, marching and hiking: Secondary | ICD-10-CM | POA: Insufficient documentation

## 2019-10-20 DIAGNOSIS — I10 Essential (primary) hypertension: Secondary | ICD-10-CM | POA: Insufficient documentation

## 2019-10-20 DIAGNOSIS — F0390 Unspecified dementia without behavioral disturbance: Secondary | ICD-10-CM | POA: Diagnosis not present

## 2019-10-20 DIAGNOSIS — S00432A Contusion of left ear, initial encounter: Secondary | ICD-10-CM

## 2019-10-20 DIAGNOSIS — S50312A Abrasion of left elbow, initial encounter: Secondary | ICD-10-CM | POA: Diagnosis not present

## 2019-10-20 DIAGNOSIS — Y92009 Unspecified place in unspecified non-institutional (private) residence as the place of occurrence of the external cause: Secondary | ICD-10-CM

## 2019-10-20 NOTE — ED Triage Notes (Signed)
Husband states fall x 1 hr ago with lac to left ear and head injury , pt is on eliquis

## 2019-10-20 NOTE — ED Notes (Signed)
PT to CT from Triage.

## 2019-10-21 MED ORDER — CEPHALEXIN 250 MG PO CAPS
ORAL_CAPSULE | ORAL | Status: AC
  Filled 2019-10-21: qty 2

## 2019-10-21 MED ORDER — CEPHALEXIN 250 MG PO CAPS
500.0000 mg | ORAL_CAPSULE | Freq: Once | ORAL | Status: AC
  Administered 2019-10-21: 500 mg via ORAL

## 2019-10-21 MED ORDER — LIDOCAINE HCL 1 % IJ SOLN
INTRAMUSCULAR | Status: AC
  Administered 2019-10-21: 20 mL
  Filled 2019-10-21: qty 20

## 2019-10-21 NOTE — ED Notes (Signed)
Pressure dressing to left ear. Wound care and dressing to left forearm skin tears. DSD

## 2019-10-21 NOTE — ED Provider Notes (Signed)
[facility] Provider Note: Georgena Spurling, MD, FACEP  CSN: [id number] MRN: [id number] ARRIVAL: 10/20/19 at [place]: [place]  Fall  Level 5 caveat: Dementia HISTORY OF PRESENT ILLNESS  10/21/19 12:14 AM Carly Alexander is a 86 y.o. female on Eliquis who fell while walking with her walker about an hour prior to arrival and has a laceration to her left ear.  She also has pain in her left elbow which she rates as a 4 out of 10, worse with movement or palpation.  She did not lose consciousness.  Bleeding has been controlled with pressure.    Past Medical History:  Diagnosis Date  . Arthritis   . 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 Dementia ([place])   . Depression    on zoloft  . Dysrhythmia    Atrial fibrilliation  . Hypertension   . Hyperthyroidism   . Parkinson disease [facility])    Neurologist Dr. Kyra Searles [facility])    Past Surgical History:  Procedure Laterality Date  . COLONOSCOPY    . excision of skin cancer    . FACIAL COSMETIC SURGERY    . LUMBAR LAMINECTOMY/DECOMPRESSION MICRODISCECTOMY Right 01/15/2018   Procedure: Microdiscectomy - Lumbar Three-Four - Right;  Surgeon: Kary Kos, MD;  Location: [place];  Service: Neurosurgery;  Laterality: Right;    No family history on file.  Social History   Tobacco Use  . Smoking status: Never Smoker  . Smokeless tobacco: Never Used  Substance Use Topics  . Alcohol use: Not Currently  . Drug use: Not Currently    Prior to Admission medications   Medication Sig Start Date End Date Taking? Authorizing Provider  amLODipine (NORVASC) 2.5 MG tablet Take 2.5 mg by mouth daily. 12/14/17   [provider]  Calcium Carbonate-Vitamin D (CALCIUM 600+D) 600-400 MG-UNIT tablet Take 1 tablet by mouth daily.    [provider]  carbidopa-levodopa (SINEMET IR) 10-100 MG tablet Take 1 tablet by mouth 3 (three) times daily.    [provider]  Carbidopa-Levodopa  ER 36.25-145 MG CPCR Take 1 tablet by mouth at bedtime.    [provider]  cyclobenzaprine (FLEXERIL) 10 MG tablet Take 1 tablet (10 mg total) by mouth 3 (three) times daily as needed for muscle spasms. 01/16/18   Meyran, Ocie Cornfield, NP  denosumab (PROLIA) 60 MG/ML SOSY injection Inject 60 mg into the skin every 6 (six) months.    [provider]  diclofenac sodium (VOLTAREN) 1 % GEL Apply 1 application topically 4 (four) times daily as needed (pain).    [provider]  donepezil (ARICEPT) 10 MG tablet Take 10 mg by mouth every evening.    [provider]  HYDROcodone-acetaminophen (NORCO/VICODIN) 5-325 MG tablet Take 1 tablet by mouth every 6 (six) hours as needed for moderate pain.    [provider]  ibuprofen (ADVIL,MOTRIN) 200 MG tablet Take 400 mg by mouth 3 (three) times daily as needed for headache or moderate pain.    [provider]  Ibuprofen-diphenhydrAMINE Cit (ADVIL PM) 200-38 MG TABS Take 1 tablet by mouth at bedtime as needed (sleep).    [provider]  loperamide (IMODIUM A-D) 2 MG tablet Take 2 mg by mouth as needed for diarrhea or loose stools.    [provider]  methimazole (TAPAZOLE) 5 MG tablet Take 5 mg by mouth daily.    [provider]  metoprolol succinate (TOPROL-XL) 25  MG 24 hr tablet Take 25 mg by mouth 2 (two) times daily.    [provider]  Multiple Vitamin (MULTIVITAMIN WITH MINERALS) TABS tablet Take 1 tablet by mouth daily.    [provider]  Multiple Vitamins-Minerals (PRESERVISION AREDS 2) CAPS Take 1 capsule by mouth daily.    [provider]  Probiotic CAPS Take 1 capsule by mouth daily.    [provider]  sertraline (ZOLOFT) 25 MG tablet Take 25 mg by mouth daily. 12/14/17   [provider]  traMADol (ULTRAM) 50 MG tablet Take 50 mg by mouth 4 (four) times daily as needed for pain. 01/07/18   [provider]  White  Petrolatum-Mineral Oil (EYE LUBRICANT OP) Place 1 drop into both eyes daily.    [provider]    Allergies Sulfa antibiotics   REVIEW OF SYSTEMS     PHYSICAL EXAMINATION  Initial Vital Signs Blood pressure (!) 181/84, pulse 74, temperature 98.2 F (36.8 C), temperature source Oral, resp. rate 18, height 5\' 1"  (1.549 m), weight 45.4 kg, SpO2 98 %.  Examination General: Well-developed, well-nourished female in no acute distress; appearance consistent with age of record HENT: normocephalic; hematoma and laceration of left ear:    Eyes: pupils equal, round and reactive to light; extraocular muscles intact Neck: supple; nontender Heart: regular rate and rhythm Lungs: clear to auscultation bilaterally Abdomen: soft; nondistended; nontender; bowel sounds present Extremities: No acute deformity; full range of motion; pulses normal Neurologic: Awake, alert; motor function intact in all extremities and symmetric; no facial droop Skin: Warm and dry; abrasions of left elbow:    Psychiatric: Normal mood and affect   RESULTS  Summary of this visit's results, reviewed and interpreted by myself:   EKG Interpretation  Date/Time:    Ventricular Rate:    PR Interval:    QRS Duration:   QT Interval:    QTC Calculation:   R Axis:     Text Interpretation:        Laboratory Studies: No results found for this or any previous visit (from the past 24 hour(s)). Imaging Studies: CT Head Wo Contrast  Result Date: 10/20/2019 CLINICAL DATA:  Fall 1 hour ago with laceration to left ear and head injury. On anticoagulation. EXAM: CT HEAD WITHOUT CONTRAST TECHNIQUE: Contiguous axial images were obtained from the base of the skull through the vertex without intravenous contrast. COMPARISON:  Head CT 07/08/2019 FINDINGS: Brain: No evidence of acute infarction, hemorrhage, hydrocephalus, extra-axial collection or mass lesion/mass effect. Stable degree of atrophy and chronic small vessel  ischemia. Bilateral basal gangliar mineralization, stable. Vascular: Atherosclerosis of skullbase vasculature without hyperdense vessel or abnormal calcification. Skull: No fracture or focal lesion. Sinuses/Orbits: Paranasal sinuses and mastoid air cells are clear. The visualized orbits are unremarkable. Bilateral cataract resection. Other: None. IMPRESSION: 1. No acute intracranial abnormality. No skull fracture. 2. Stable atrophy and chronic small vessel ischemia. Electronically Signed   By: Keith Rake M.D.   On: 10/20/2019 23:43    ED COURSE and MDM  Nursing notes, initial and subsequent vitals signs, including pulse oximetry, reviewed and interpreted by myself.  Vitals:   10/20/19 2320 10/21/19 0030  BP: (!) 181/84 (!) 176/88  Pulse: 74 (!) 58  Resp: 18 12  Temp: 98.2 F (36.8 C)   TempSrc: Oral   SpO2: 98% 99%  Weight: 45.4 kg   Height: 5\' 1"  (1.549 m)    Medications  lidocaine (XYLOCAINE) 1 % (with pres) injection (has no administration  in time range)  cephALEXin (KEFLEX) capsule 500 mg (has no administration in time range)    Pressure dressing applied after suturing to help minimize cauliflower ear formation.  Keflex given for wound infection prophylaxis.   PROCEDURES  Procedures LACERATION REPAIR Performed by: Karen Chafe Marquesa Rath Authorized by: Karen Chafe Tiara Maultsby Consent: Verbal consent obtained. Risks and benefits: risks, benefits and alternatives were discussed Consent given by: patient Patient identity confirmed: provided demographic data Prepped and Draped in normal sterile fashion Wound explored  Laceration Location: Left external ear  Laceration Length: 3 cm  No Foreign Bodies seen or palpated  Anesthesia: local infiltration  Local anesthetic: lidocaine 1% without epinephrine  Anesthetic total: 3 ml  Irrigation method: syringe Amount of cleaning: standard  Skin closure: 5-0 Vicryl Rapide  Number of sutures: 7  Technique: Simple interrupted  Patient  tolerance: Patient tolerated the procedure well with no immediate complications.     ED DIAGNOSES     ICD-10-CM   1. Fall in home, initial encounter  W19.XXXA    Y92.009   2. Hematoma of left external ear, initial encounter  S00.432A   3. Abrasion of skin of left elbow  S50.312A   4. Laceration of left external ear, initial encounter  S01.312A        Shanon Rosser, MD 10/21/19 [phone]

## 2020-05-23 ENCOUNTER — Emergency Department (HOSPITAL_COMMUNITY)
Admission: EM | Admit: 2020-05-23 | Discharge: 2020-05-24 | Disposition: A | Discharge status: Home / Self Care | Payer: Medicare Other | Attending: Emergency Medicine | Admitting: Emergency Medicine

## 2020-05-23 ENCOUNTER — Emergency Department (HOSPITAL_COMMUNITY): Payer: Medicare Other

## 2020-05-23 DIAGNOSIS — Y92002 Bathroom of unspecified non-institutional (private) residence single-family (private) house as the place of occurrence of the external cause: Secondary | ICD-10-CM | POA: Insufficient documentation

## 2020-05-23 DIAGNOSIS — G20 Parkinson's disease: Secondary | ICD-10-CM | POA: Insufficient documentation

## 2020-05-23 DIAGNOSIS — S0003XA Contusion of scalp, initial encounter: Secondary | ICD-10-CM | POA: Diagnosis not present

## 2020-05-23 DIAGNOSIS — W0110XA Fall on same level from slipping, tripping and stumbling with subsequent striking against unspecified object, initial encounter: Secondary | ICD-10-CM | POA: Diagnosis not present

## 2020-05-23 DIAGNOSIS — W19XXXA Unspecified fall, initial encounter: Secondary | ICD-10-CM

## 2020-05-23 DIAGNOSIS — S0990XA Unspecified injury of head, initial encounter: Secondary | ICD-10-CM | POA: Diagnosis present

## 2020-05-23 LAB — SAMPLE TO BLOOD BANK

## 2020-05-23 LAB — BASIC METABOLIC PANEL
Anion gap: 12 (ref 5–15)
BUN: 16 mg/dL (ref 8–23)
CO2: 24 mmol/L (ref 22–32)
Calcium: 9.1 mg/dL (ref 8.9–10.3)
Chloride: 102 mmol/L (ref 98–111)
Creatinine, Ser: 1.03 mg/dL — ABNORMAL HIGH (ref 0.44–1.00)
GFR, Estimated: 54 mL/min — ABNORMAL LOW (ref >60)
Glucose, Bld: 146 mg/dL — ABNORMAL HIGH (ref 70–99)
Potassium: 3.7 mmol/L (ref 3.5–5.1)
Sodium: 138 mmol/L (ref 135–145)

## 2020-05-23 LAB — CBC
HCT: 39.6 % (ref 36.0–46.0)
Hemoglobin: 12.4 g/dL (ref 12.0–15.0)
MCH: 29.6 pg (ref 26.0–34.0)
MCHC: 31.3 g/dL (ref 30.0–36.0)
MCV: 94.5 fL (ref 80.0–100.0)
Platelets: 229 10*3/uL (ref 150–400)
RBC: 4.19 MIL/uL (ref 3.87–5.11)
RDW: 12.5 % (ref 11.5–15.5)
WBC: 7 10*3/uL (ref 4.0–10.5)
nRBC: 0 % (ref 0.0–0.2)

## 2020-05-23 NOTE — ED Provider Notes (Signed)
[facility] EMERGENCY DEPARTMENT Provider Note   CSN: [id number] Arrival date & time: 05/23/20  2125     History Chief Complaint  Patient presents with  . Fall    Carly Alexander is a 86 y.o. female with with Parkinson's and Lewy body dementia presents after a ground-level fall at home.  History obtained from EMS and via telephone call with patient's spouse, Dr. Jeri Modena.  Per EMS and Dr. Hardin Negus, the patient was walking with her walker into the bathroom.  Per her husband, she normally hesitates when she goes through certain doorways and he thinks that she did this, let go of one of the handles of her walker (which happens often per husband).  He states it seems like the walker went ahead of her and she lost her balance.  She fell into the tile floor and her hit her head on the ledge of the shower/tub.  Her husband stated that she lost consciousness for about 90 seconds to 2 minutes.  However, she was breathing and had good pulses.  Of note, patient has been is retired Consulting civil engineer. He does not think she had a syncopal fall.  The history is provided by the EMS personnel and the spouse. The history is limited by the condition of the patient (dementia).  Trauma Mechanism of injury: fall Injury location: head/neck Injury location detail: scalp Incident location: home Time since incident: just prior to arrival. Arrived directly from scene: yes   Fall:      Fall occurred: walking      Height of fall: Ground-level fall      Impact surface: Tile bathroom floor, bathtub.      Point of impact: head      Entrapped after fall: no  EMS/PTA data:      Loss of consciousness: yes  Current symptoms:      Pain quality: unable to describe      Associated symptoms:            Reports loss of consciousness.            Denies abdominal pain, back pain, chest pain, difficulty breathing, nausea, seizures and vomiting.   Relevant PMH:      Medical risk  factors:            Dementia      Pharmacological risk factors:            Anticoagulation therapy.       No past medical history on file.  There are no problems to display for this patient.    OB History   No obstetric history on file.     No family history on file.  Social History   Tobacco Use  . Smoking status: Not on file  Substance Use Topics  . Alcohol use: Not on file  . Drug use: Not on file    Home Medications Prior to Admission medications   Not on File    Allergies    Patient has no known allergies.  Review of Systems   Review of Systems  Unable to perform ROS: Dementia  Cardiovascular: Negative for chest pain.  Gastrointestinal: Negative for abdominal pain, nausea and vomiting.  Musculoskeletal: Negative for back pain.  Neurological: Positive for loss of consciousness. Negative for seizures.    Physical Exam Updated Vital Signs BP 140/70   Pulse 64   Temp 97.9 F (36.6 C) (Oral)   Resp 20   SpO2 99%  Physical Exam Vitals and nursing note reviewed.  Constitutional:      General: She is not in acute distress.    Appearance: She is well-developed.  HENT:     Head: Normocephalic.     Comments: Right frontopariental hematoma, no overlying laceration or wound. Small non-acute punctate wound to left eyebrow and non-acute-appearing abrasion to right nose    Right Ear: External ear normal.     Left Ear: External ear normal.     Mouth/Throat:     Mouth: Mucous membranes are moist.     Pharynx: No posterior oropharyngeal erythema.  Eyes:     Extraocular Movements: Extraocular movements intact.     Conjunctiva/sclera: Conjunctivae normal.     Pupils: Pupils are equal, round, and reactive to light.  Cardiovascular:     Rate and Rhythm: Normal rate and regular rhythm.     Pulses: Normal pulses.     Heart sounds: No murmur heard.   Pulmonary:     Effort: Pulmonary effort is normal. No respiratory distress.     Breath sounds: Normal breath  sounds.  Abdominal:     Palpations: Abdomen is soft.     Tenderness: There is no abdominal tenderness.  Musculoskeletal:        General: No swelling, tenderness, deformity or signs of injury.     Cervical back: Normal range of motion and neck supple. No rigidity or tenderness.     Right lower leg: No edema.     Left lower leg: No edema.     Comments: Pupils PERRL bilaterally, midface stable.  Ears, eyes, nose, and oropharynx atraumatic.  Trachea midline.  Clavicle stable.  Chest able to AP and lateral compression.  Pelvis stable to AP and lateral compression.  Depressed pulses in all 4 extremities.  Neurovascular intact distally in all 4 extremities.  Skin:    General: Skin is warm and dry.  Neurological:     Mental Status: She is alert. Mental status is at baseline. She is disoriented.     Cranial Nerves: No cranial nerve deficit.     Sensory: No sensory deficit.     Motor: No weakness.     Coordination: Coordination normal.  Psychiatric:        Mood and Affect: Mood normal.        Behavior: Behavior normal.     ED Results / Procedures / Treatments   Labs (all labs ordered are listed, but only abnormal results are displayed) Labs Reviewed  BASIC METABOLIC PANEL - Abnormal; Notable for the following components:      Result Value   Glucose, Bld 146 (*)    Creatinine, Ser 1.03 (*)    GFR, Estimated 54 (*)    All other components within normal limits  CBC  URINALYSIS, ROUTINE W REFLEX MICROSCOPIC  SAMPLE TO BLOOD BANK    EKG EKG Interpretation  Date/Time:  Monday May 23 2020 21:36:22 EST Ventricular Rate:  60 PR Interval:    QRS Duration: 92 QT Interval:  470 QTC Calculation: 470 R Axis:     Text Interpretation: Sinus rhythm Left axis deviation No old tracing to compare Confirmed by Noemi Chapel [phone]) on 05/23/2020 9:37:30 PM   Radiology CT Head Wo Contrast  Result Date: 05/23/2020 CLINICAL DATA:  Fall into door frame, on Eliquis, transient loss of  consciousness EXAM: CT HEAD WITHOUT CONTRAST CT CERVICAL SPINE WITHOUT CONTRAST TECHNIQUE: Multidetector CT imaging of the head and cervical spine was performed following the standard protocol without  intravenous contrast. Multiplanar CT image reconstructions of the cervical spine were also generated. COMPARISON:  CT head 10/20/2019 FINDINGS: CT HEAD FINDINGS Brain: Very mild parafalcine hyperattenuation is not significantly changed from comparison images [phone] and likely reflect some early developing dural calcifications. No clear evidence of hyperdense hemorrhage or extra-axial collection. No CT evidence of large vascular territory infarct. Stable hypoattenuating foci in the basal ganglia, right thalamus and right cerebellum likely reflect sequela of remote lacunar type infarcts. Patchy areas of white matter hypoattenuation are most compatible with chronic microvascular angiopathy. Benign senescent mineralization of the basal ganglia. Symmetric prominence of the ventricles, cisterns and sulci compatible with parenchymal volume loss. Vascular: Atherosclerotic calcification of the carotid siphons and intradural vertebral arteries. No hyperdense vessel. Skull: Right frontotemporal scalp swelling and thin crescentic hematoma measuring up to 5 mm in maximal thickness. No subjacent calvarial fracture or acute osseous injuries. Sinuses/Orbits: Paranasal sinuses and mastoid air cells are predominantly clear. Orbital structures are unremarkable aside from prior lens extractions. Other: Debris in the bilateral external auditory canals. CT CERVICAL SPINE FINDINGS Alignment: Preservation of normal cervical lordosis. Mild rightward lateral flexion of the neck. Slight rightward cranial rotation as well. Stepwise retrolisthesis C3-C7 is favored to be on a spondylitic basis with multilevel discogenic and facet degenerative changes. No evidence of traumatic listhesis. No abnormally widened, perched or jumped facets. Normal  alignment of the craniocervical and atlantoaxial articulations. Skull base and vertebrae: No acute skull base fracture. No vertebral body fracture or height loss. Normal bone mineralization. No worrisome osseous lesions. Arthrosis at the atlantodental interval. Multilevel spondylitic changes, as below. Soft tissues and spinal canal: No pre or paravertebral fluid or swelling. No visible canal hematoma. Disc levels: Multilevel intervertebral disc height loss with spondylitic endplate changes including multilevel disc osteophyte complexes and ligamentum flavum infolding most pronounced C3-C5 where there is mild resulting canal stenosis at these levels. Additional multilevel uncinate spurring and facet hypertrophic changes are present as well resulting in mild-to-moderate multilevel neural foraminal narrowing throughout the cervical spine more moderate to severe stenosis bilaterally C5-6. Upper chest: Interlobular septal thickening, may reflect early edema. Other acute abnormalities in the upper chest. Other: Heterogeneous and enlarged thyroid gland with multiple hypoattenuating nodules. IMPRESSION: 1. Right frontotemporal scalp swelling and thin crescentic hematoma measuring up to 5 mm in maximal thickness. No subjacent calvarial fracture or acute osseous injuries. 2. No evidence of acute intracranial abnormality. 3. Stable parenchymal volume loss and chronic microvascular ischemic white matter disease. 4. Likely remote lacunar type infarcts in the basal ganglia, right thalamus and right cerebellum. 5. No evidence of acute fracture or traumatic listhesis of the cervical spine. 6. Multilevel degenerative changes of the cervical spine, most pronounced C3-C5 where there is mild resulting canal stenosis. Additional moderate to severe multilevel neural foraminal narrowing with more moderate to severe stenosis bilaterally C5-6. 7. Heterogeneous and enlarged thyroid gland with multiple hypoattenuating nodules. Recommend  thyroid US (ref: J Am Coll Radiol. 2015 Feb;12(2): 143-50). 8. Interlobular septal thickening in the lung apices, may reflect early edema. Electronically Signed   By: Lovena Le M.D.   On: 05/23/2020 22:07   CT Cervical Spine Wo Contrast  Result Date: 05/23/2020 CLINICAL DATA:  Fall into door frame, on Eliquis, transient loss of consciousness EXAM: CT HEAD WITHOUT CONTRAST CT CERVICAL SPINE WITHOUT CONTRAST TECHNIQUE: Multidetector CT imaging of the head and cervical spine was performed following the standard protocol without intravenous contrast. Multiplanar CT image reconstructions of the cervical spine were also generated. COMPARISON:  CT head 10/20/2019 FINDINGS: CT HEAD FINDINGS Brain: Very mild parafalcine hyperattenuation is not significantly changed from comparison images [phone] and likely reflect some early developing dural calcifications. No clear evidence of hyperdense hemorrhage or extra-axial collection. No CT evidence of large vascular territory infarct. Stable hypoattenuating foci in the basal ganglia, right thalamus and right cerebellum likely reflect sequela of remote lacunar type infarcts. Patchy areas of white matter hypoattenuation are most compatible with chronic microvascular angiopathy. Benign senescent mineralization of the basal ganglia. Symmetric prominence of the ventricles, cisterns and sulci compatible with parenchymal volume loss. Vascular: Atherosclerotic calcification of the carotid siphons and intradural vertebral arteries. No hyperdense vessel. Skull: Right frontotemporal scalp swelling and thin crescentic hematoma measuring up to 5 mm in maximal thickness. No subjacent calvarial fracture or acute osseous injuries. Sinuses/Orbits: Paranasal sinuses and mastoid air cells are predominantly clear. Orbital structures are unremarkable aside from prior lens extractions. Other: Debris in the bilateral external auditory canals. CT CERVICAL SPINE FINDINGS Alignment: Preservation of  normal cervical lordosis. Mild rightward lateral flexion of the neck. Slight rightward cranial rotation as well. Stepwise retrolisthesis C3-C7 is favored to be on a spondylitic basis with multilevel discogenic and facet degenerative changes. No evidence of traumatic listhesis. No abnormally widened, perched or jumped facets. Normal alignment of the craniocervical and atlantoaxial articulations. Skull base and vertebrae: No acute skull base fracture. No vertebral body fracture or height loss. Normal bone mineralization. No worrisome osseous lesions. Arthrosis at the atlantodental interval. Multilevel spondylitic changes, as below. Soft tissues and spinal canal: No pre or paravertebral fluid or swelling. No visible canal hematoma. Disc levels: Multilevel intervertebral disc height loss with spondylitic endplate changes including multilevel disc osteophyte complexes and ligamentum flavum infolding most pronounced C3-C5 where there is mild resulting canal stenosis at these levels. Additional multilevel uncinate spurring and facet hypertrophic changes are present as well resulting in mild-to-moderate multilevel neural foraminal narrowing throughout the cervical spine more moderate to severe stenosis bilaterally C5-6. Upper chest: Interlobular septal thickening, may reflect early edema. Other acute abnormalities in the upper chest. Other: Heterogeneous and enlarged thyroid gland with multiple hypoattenuating nodules. IMPRESSION: 1. Right frontotemporal scalp swelling and thin crescentic hematoma measuring up to 5 mm in maximal thickness. No subjacent calvarial fracture or acute osseous injuries. 2. No evidence of acute intracranial abnormality. 3. Stable parenchymal volume loss and chronic microvascular ischemic white matter disease. 4. Likely remote lacunar type infarcts in the basal ganglia, right thalamus and right cerebellum. 5. No evidence of acute fracture or traumatic listhesis of the cervical spine. 6. Multilevel  degenerative changes of the cervical spine, most pronounced C3-C5 where there is mild resulting canal stenosis. Additional moderate to severe multilevel neural foraminal narrowing with more moderate to severe stenosis bilaterally C5-6. 7. Heterogeneous and enlarged thyroid gland with multiple hypoattenuating nodules. Recommend thyroid US (ref: J Am Coll Radiol. 2015 Feb;12(2): 143-50). 8. Interlobular septal thickening in the lung apices, may reflect early edema. Electronically Signed   By: Lovena Le M.D.   On: 05/23/2020 22:07    Procedures Procedures (including critical care time)  Medications Ordered in ED Medications - No data to display  ED Course  I have reviewed the triage vital signs and the nursing notes.  Pertinent labs & imaging results that were available during my care of the patient were reviewed by me and considered in my medical decision making (see chart for details).    MDM Rules/Calculators/A&P  MDM: Ashanty Coltrane is a 86 y.o. female who presents with fall as per above. I have reviewed the nursing documentation for past medical history, family history, and social history. Pertinent previous records reviewed. She is awake, alert. HDS. Afebrile. Physical exam is most notable for right scalp hematoma without any other obvious traumatic injuries.  Mental status at baseline.  Labs: CBC and BMP unremarkable. EKG: EKG with borderline sinus bradycardia with a ventricular rate of 59, QRS 93, QTc 447. No signs of acute ischemia, infarct, or significant electrical abnormalities. No STEMI, ST depressions, or significant T wave inversions. No evidence of a High-Grade Conduction Block, WPW, Brugada Sign, ARVC, DeWinters T Waves, or Wellens Waves. Imaging: CT head and C-spine with no acute traumatic findings.  Patient has multiple degenerative findings as well as incidental thyroid nodules that will need a follow-up thyroid ultrasound. Consults:  none   Differential Dx: I am most concerned for scalp hematoma without active bleeding after a mechanical fall. Given history, physical exam, and work-up, I do not think she has intracranial hemorrhage, C/T/L-spine fracture or malalignment, intrathoracic or intra-abdominal injury, rib fractures, pelvic fracture, extremity fractures or malalignment, neurovascular injury, syncope, ACS, PE, vertebrobasilar insufficiency, or other emergent etiology.  MDM: Robertine Kipper is a 86 y.o. female presents after likely mechanical ground-level fall.  Loss conscious likely related to head trauma, possible concussion.  Patient has a scalp hematoma but no underlying intracalvarial bleeding. Doubt active bleeding into hematoma. Patient is at neurologic baseline per husband who arrived at bedside.  Discussed CT findings with husband at bedside, provided with printout of radiology read, and recommended follow-up for thyroid ultrasound.  Husband states that they already follow with endocrinology at [place] and will follow up with them.  Also recommended close PCP follow-up.  Patient normally walks with a walker at home and offered to walk her with a walker in ED; however, husband felt it would be reasonable to discharge patient in wheelchair and agreed to return to the ED if patient was unable to walk. I feel this plan is reasonable and thus that the patient is stable for discharge home.  On reassessment just prior to discharge, patient awake, alert, at her mental status baseline.  Strict return precautions provided. Encouraged her husband to have her follow-up with her PCP on an outpatient basis. Questions were answered.  Patient discharged in stable condition.  The plan for this patient was discussed with Dr. Sabra Heck, who voiced agreement and who oversaw evaluation and treatment of this patient.   Final Clinical Impression(s) / ED Diagnoses Final diagnoses:  Fall, initial encounter  Contusion of scalp,  initial encounter    Rx / DC Orders ED Discharge Orders    None       Kohner Orlick, MD 05/23/20 [id number]    Noemi Chapel, MD 05/25/20 [phone]

## 2020-05-23 NOTE — ED Triage Notes (Signed)
Pt arrived via [facility] from home after fall into doorframe. Pt was made level 2 trauma d/t pt being on elliquis. EMS states pt fell into door frame and had 60-90 second LOC per husband. Pt has r hematoma on R paritel of head,

## 2020-05-23 NOTE — ED Provider Notes (Signed)
I saw and evaluated the patient, reviewed the resident's note and I agree with the findings and plan.  Pertinent History: This patient is a elderly female on anticoagulants who had a fall in the bathroom striking her head either on the door the bathtub or the ground, she had a 60-92nd loss of consciousness, the patient is not having any distress or complaints at this time, no tenderness over the cervical spine on my exam, she is able to move all 4 extremities and answer my questions though she does seem to be confused whether this is related to cognitive delay or not it is unclear.  The paramedics report no abnormal vital signs, no seizures, no vomiting, no abnormal mental status in route to the hospital.  Small hematoma to the right lateral head, no other signs of injuries to the body, the patient is well-appearing and in no distress.  CT scan, anticipate discharge once negative  Talk with family to find out story.  I was personally present and directly supervised the following procedures:  Trauma  I personally interpreted the EKG as well as the resident and agree with the interpretation on the resident's chart.  Final diagnoses:  Fall, initial encounter  Contusion of scalp, initial encounter      Noemi Chapel, MD 05/25/20 [phone]

## 2020-05-23 NOTE — Discharge Instructions (Addendum)
Thank you for allowing [place] to take care of Ms. Carly Alexander.    Follow-up with her PCP. Also, follow-up with her endocrinologist regarding the nodules in her thyroid seen on the CT scan.  If she is unable to walk/ambulate, has any altered mental status, chest pain, shortness of breath,  abdominal pain, or other concerning symptoms, please call [phone] and come back to the ER soon as possible.

## 2020-05-23 NOTE — Progress Notes (Signed)
Orthopedic Tech Progress Note Patient Details:  Carly Alexander 06/25/1873 [id number] Level 2 Trauma  Patient ID: Carly Alexander, female   DOB: 06/25/1873, 86 y.o.   MRN: [id number]   Jearld Lesch 05/23/2020, 9:30 PM

## 2020-08-23 DEATH — deceased

## 2021-12-02 IMAGING — CT CT HEAD W/O CM
3 series · 15 of 47 positions shown, 18 images · non-contrast
Comparison: Head CT 07/08/2019

CLINICAL DATA: Fall 1 hour ago with laceration to left ear and head
injury. On anticoagulation.

EXAM:
CT HEAD WITHOUT CONTRAST
TECHNIQUE: Contiguous axial images were obtained from the base of the skull
through the vertex without intravenous contrast.

[Series 2: head wo · axial · 0.49mm/px · z∈[-134,-8]mm · 9 of 31 slices shown, 12 images]
[im 3/31  brain]
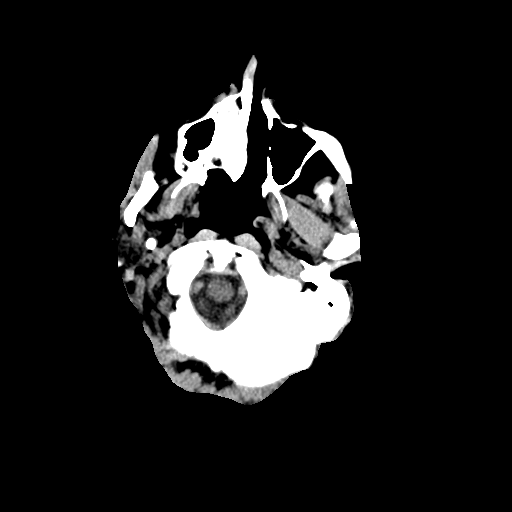
[im 3/31  bone]
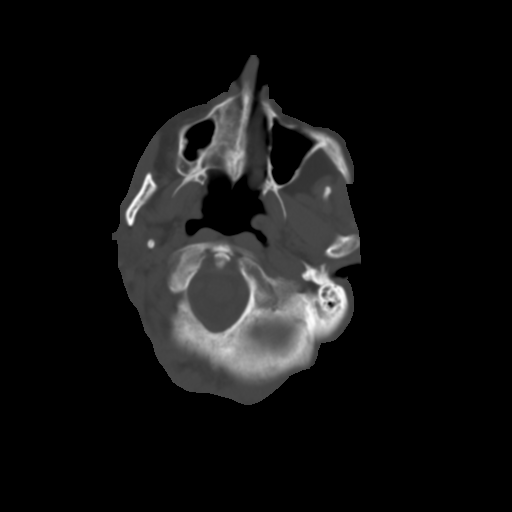
[im 6/31  brain]
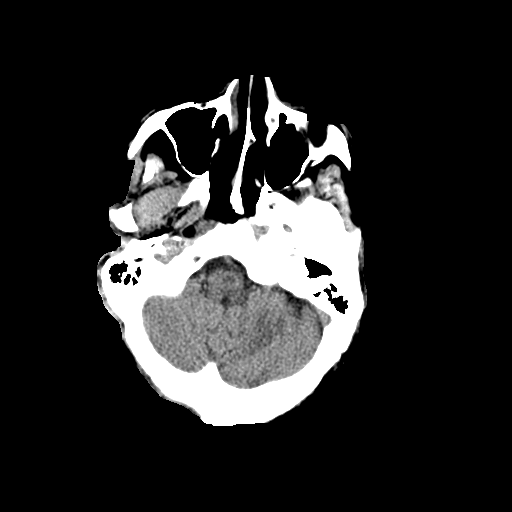
[im 9/31  brain]
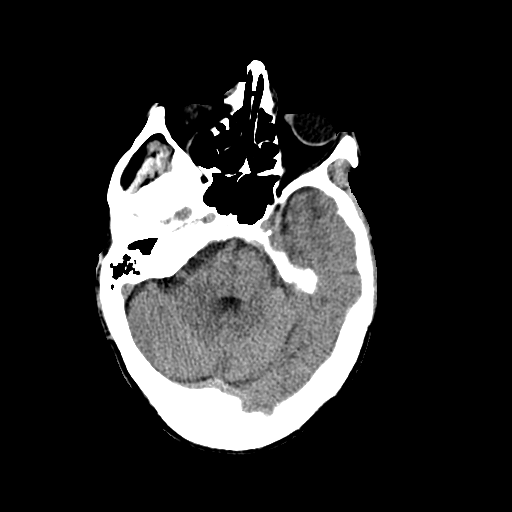
[im 12/31  brain]
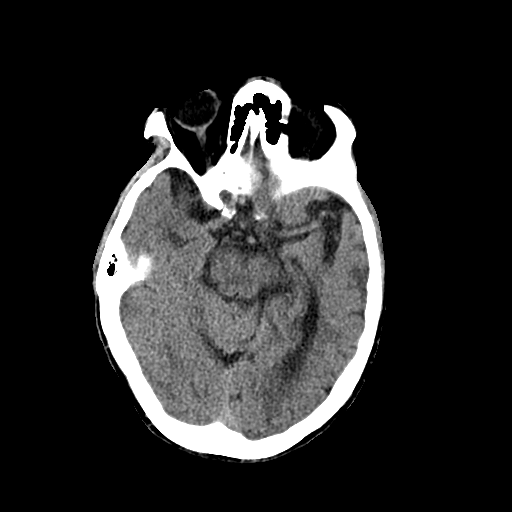
[im 16/31  brain]
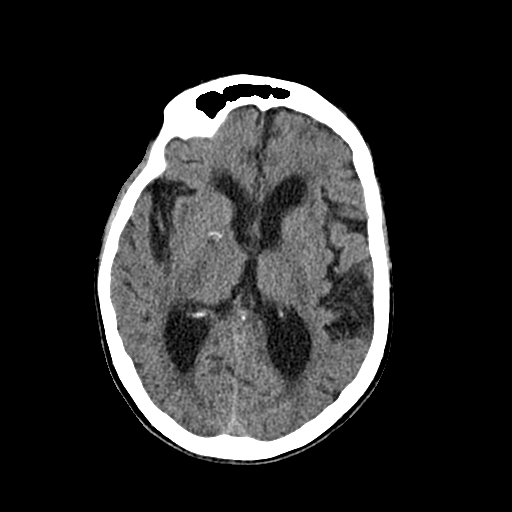
[im 16/31  bone]
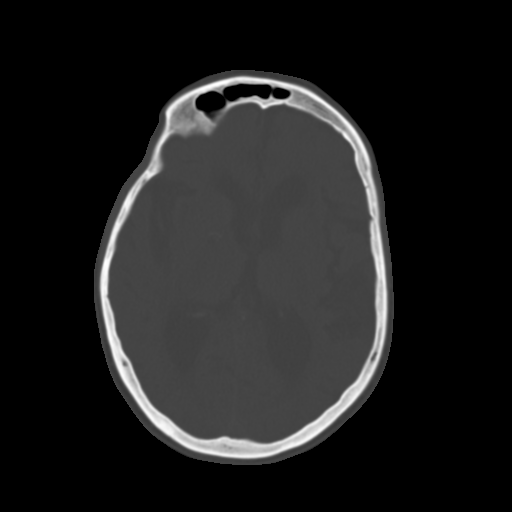
[im 19/31  brain]
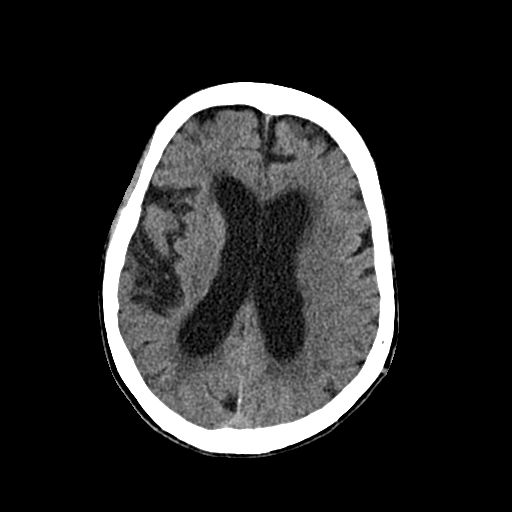
[im 22/31  brain]
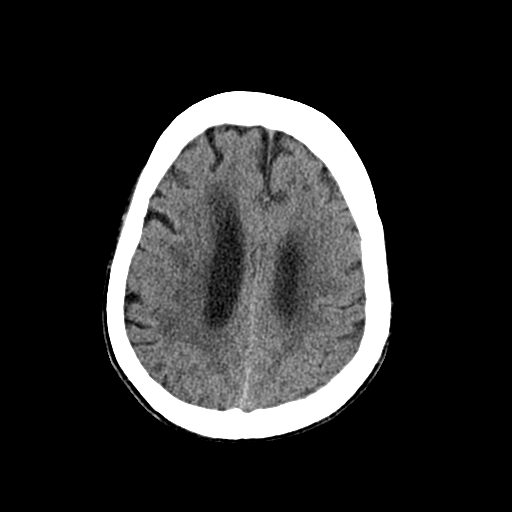
[im 25/31  brain]
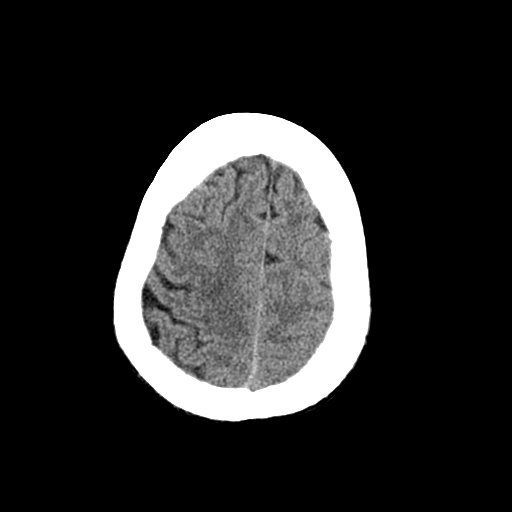
[im 28/31  brain]
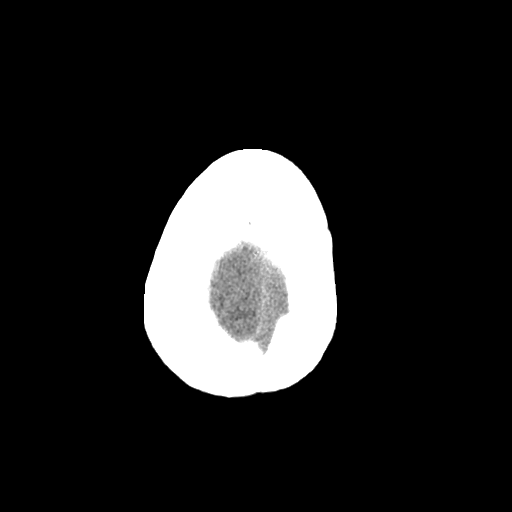
[im 28/31  bone]
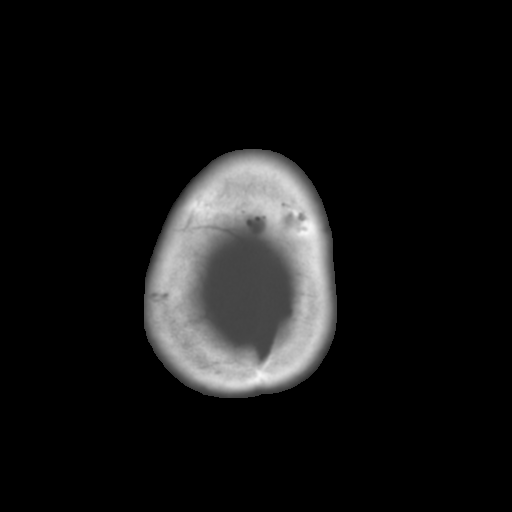

[Series 4: cor soft · coronal · 0.33mm/px · 3 of 79 slices shown]
[im 27/79  brain]
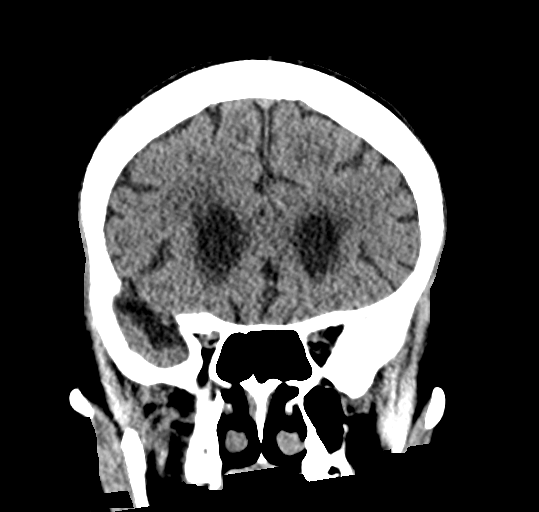
[im 35/79  brain]
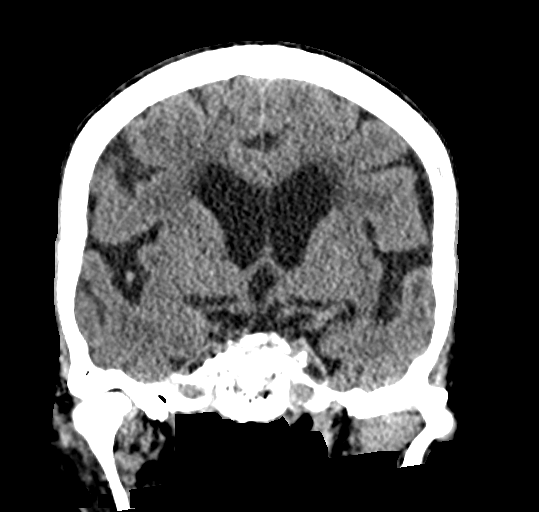
[im 44/79  brain]
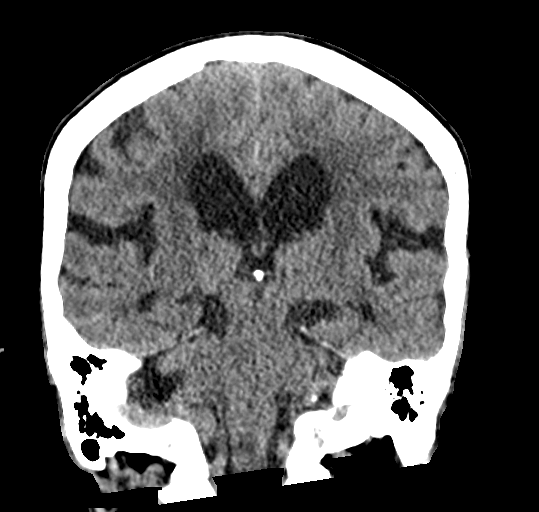

[Series 5: sag soft · sagittal · 0.30mm/px · 3 of 63 slices shown]
[im 25/63  brain]
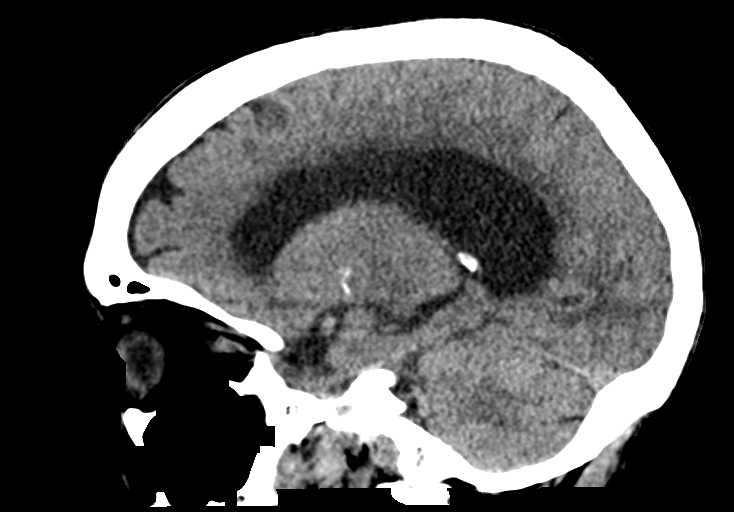
[im 32/63  brain]
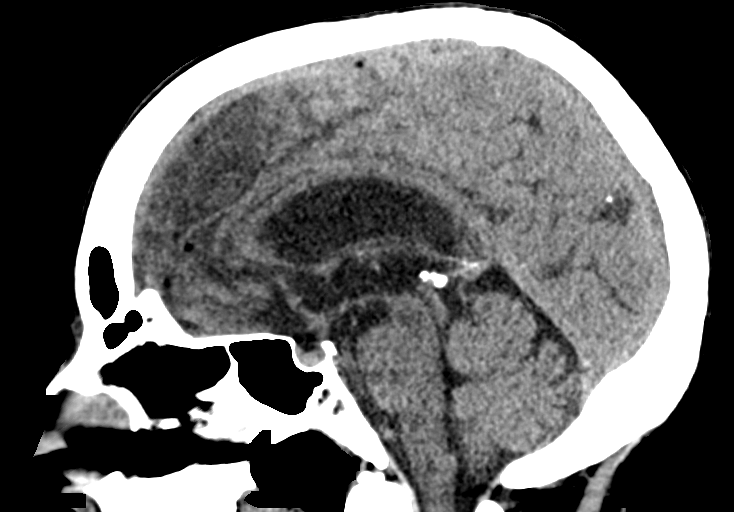
[im 38/63  brain]
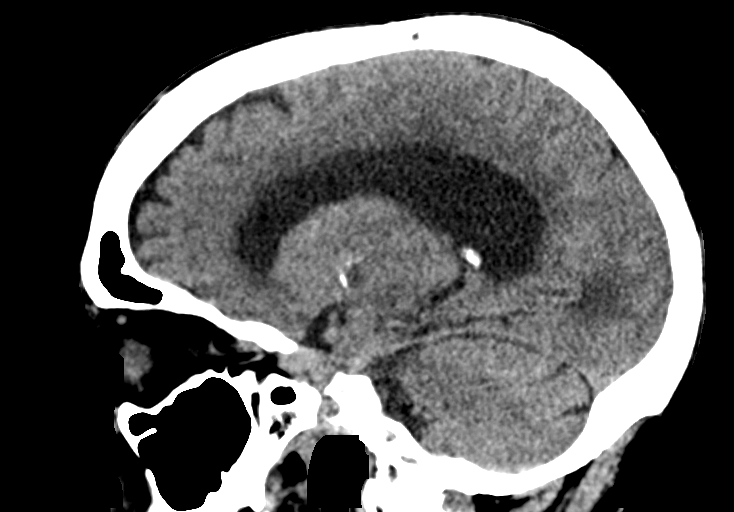

[15 of 47 positions shown; findings below may reference images not displayed]

FINDINGS: Brain: No evidence of acute infarction, hemorrhage, hydrocephalus,
extra-axial collection or mass lesion/mass effect. Stable degree of
atrophy and chronic small vessel ischemia. Bilateral basal gangliar
mineralization, stable.

Vascular: Atherosclerosis of skullbase vasculature without
hyperdense vessel or abnormal calcification.

Skull: No fracture or focal lesion.

Sinuses/Orbits: Paranasal sinuses and mastoid air cells are clear.
The visualized orbits are unremarkable. Bilateral cataract
resection.

Other: None.
IMPRESSION: 1. No acute intracranial abnormality. No skull fracture.
2. Stable atrophy and chronic small vessel ischemia.
